# Patient Record
Sex: Female | Born: 1950
Health system: Southern US, Community
[De-identification: ages and names within clinical notes are randomized; demographics above are authoritative.]

## PROBLEM LIST (undated history)

## (undated) DIAGNOSIS — L309 Dermatitis, unspecified: Secondary | ICD-10-CM

## (undated) DIAGNOSIS — M1990 Unspecified osteoarthritis, unspecified site: Secondary | ICD-10-CM

## (undated) DIAGNOSIS — B019 Varicella without complication: Secondary | ICD-10-CM

## (undated) DIAGNOSIS — Z973 Presence of spectacles and contact lenses: Secondary | ICD-10-CM

## (undated) DIAGNOSIS — L509 Urticaria, unspecified: Secondary | ICD-10-CM

## (undated) DIAGNOSIS — J45909 Unspecified asthma, uncomplicated: Secondary | ICD-10-CM

## (undated) DIAGNOSIS — M479 Spondylosis, unspecified: Secondary | ICD-10-CM

## (undated) DIAGNOSIS — Z789 Other specified health status: Secondary | ICD-10-CM

## (undated) HISTORY — PX: KNEE SURGERY: SHX244

## (undated) HISTORY — DX: Urticaria, unspecified: L50.9

## (undated) HISTORY — DX: Varicella without complication: B01.9

## (undated) HISTORY — PX: COLONOSCOPY: SHX5424

## (undated) HISTORY — PX: TONSILLECTOMY: SUR1361

## (undated) HISTORY — DX: Spondylosis, unspecified: M47.9

---

## 1988-10-30 HISTORY — PX: OTHER SURGICAL HISTORY: SHX169

## 1993-03-01 HISTORY — PX: KNEE ARTHROSCOPY: SHX127

## 1998-01-28 ENCOUNTER — Other Ambulatory Visit: Admission: RE | Admit: 1998-01-28 | Discharge: 1998-01-28 | Payer: Self-pay | Admitting: Obstetrics and Gynecology

## 2000-03-01 HISTORY — PX: ABDOMINAL HYSTERECTOMY: SHX81

## 2000-04-25 ENCOUNTER — Other Ambulatory Visit: Admission: RE | Admit: 2000-04-25 | Discharge: 2000-04-25 | Payer: Self-pay | Admitting: Obstetrics and Gynecology

## 2001-02-27 ENCOUNTER — Encounter (INDEPENDENT_AMBULATORY_CARE_PROVIDER_SITE_OTHER): Payer: Self-pay | Admitting: Specialist

## 2001-02-28 ENCOUNTER — Inpatient Hospital Stay (HOSPITAL_COMMUNITY): Admission: RE | Admit: 2001-02-28 | Discharge: 2001-03-01 | Payer: Self-pay | Admitting: Obstetrics and Gynecology

## 2003-02-27 ENCOUNTER — Other Ambulatory Visit: Admission: RE | Admit: 2003-02-27 | Discharge: 2003-02-27 | Payer: Self-pay | Admitting: Obstetrics and Gynecology

## 2005-09-15 ENCOUNTER — Emergency Department (HOSPITAL_COMMUNITY): Admission: EM | Admit: 2005-09-15 | Discharge: 2005-09-15 | Payer: Self-pay | Admitting: Emergency Medicine

## 2008-06-17 ENCOUNTER — Emergency Department (HOSPITAL_COMMUNITY): Admission: EM | Admit: 2008-06-17 | Discharge: 2008-06-17 | Payer: Self-pay | Admitting: Emergency Medicine

## 2010-07-17 NOTE — H&P (Signed)
[facility]  Patient:    Cheryl Wang, Cheryl Wang Visit Number: [id number] MRN: [id number]          Service Type: Attending:  Juluis Mire, M.D. Dictated by:   Juluis Mire, M.D. Adm. Date:  02/27/01                           History and Physical  HISTORY:                      The patient is a 60 year old gravida 3, para 3 married white female who presents for laparoscopically assisted vaginal hysterectomy for management of symptoms uterine fibroids.  HISTORY OF PRESENT ILLNESS:   The patient has been followed with increasing menstrual flow and problems.  Her husband has had a previous vasectomy.  Her cycles are now approximately 2-1/2 weeks apart.  They are relatively heavy, lasting up to seven days.  She has increasing clotting and dysmenorrhea.  She is using tampons during the heavy days, and changes them every 2-3 hours. Ultrasounds have confirmed enlarging uterine fibroids.  The largest one measures up to approximately 4-5 cm.  Because of the abnormal bleeding, she was placed on birth control pills.  She did not really respond to these, and continued to have significant heavy bleeding.  We did do an endometrial sampling on her, which was unremarkable.  Because of continued bleeding unresponsive to conservative management thought to be secondary to uterine fibroids, she is scheduled to undergo laparoscopically assisted vaginal hysterectomy.  Other options have been discussed including other hormonal agents.  We have discussed radiologic embolization, endometrial ablation adequate myomectomy.  We have discussed the risks and benefits of ovarian removal versus leaving them in place.  She has decided to leave her ovaries in place.  She does understand the potential for malignant transformation.  ALLERGIES:                    No known drug allergies.  MEDICATIONS:                  Vioxx.  PAST MEDICAL HISTORY:         Usual childhood diseases.  No  significant sequelae.  PAST SURGICAL HISTORY:        Knee surgery.  PAST OBSTETRIC HISTORY:       Three vaginal deliveries.  FAMILY HISTORY:               Noncontributory.  SOCIAL HISTORY:               No tobacco or alcohol use.  REVIEW OF SYSTEMS:            Noncontributory.  PHYSICAL EXAMINATION:  VITAL SIGNS:                  The patient is afebrile with stable vital signs.  HEENT:                        Normocephalic.  Pupils equal, round and reactive to light and accommodation.  Extraocular movements intact.  Sclerae and conjunctivae clear.  Oropharynx clear.  NECK:                         Without thyromegaly.  BREASTS:  No discrete masses.  LUNGS:                        Clear.  CARDIOVASCULAR:               Regular rate and rhythm without murmurs or gallops.  ABDOMEN:                      Benign.  No masses, organomegaly or tenderness.  PELVIC:                       Normal external genitalia.  THe vaginal mucosa is clear.  The cervix is unremarkable.  The uterus is approximately ten weeks in size.  Adnexa unremarkable.  Rectovaginal exam clear.  EXTREMITIES:                  Trace edema.  NEUROLOGIC:                   Grossly within normal limits.  IMPRESSION:                   Uterine fibroids with associated menorrhagia unresponsive to conservative management.  PLAN:                         The patient will undergo laparoscopically assisted vaginal hysterectomy.  The risks of surgery have been discussed. This includes the risks of anesthesia; the risk of infection; the risk of hemorrhage that could necessitate transfusion; the risk of injury to adjacent organs including bladder, bowel or ureters that could require further exploratory surgery; the risk of deep venous thrombosis and pulmonary embolus. The patient expressed an understanding of the indications and risks and is accepting of them. Dictated by:   Juluis Mire,  M.D. Attending:  Juluis Mire, M.D. DD:  02/27/01 TD:  02/27/01 Job: [phone] [id number]

## 2010-07-17 NOTE — Op Note (Signed)
[facility]  Patient:    Cheryl Wang, Cheryl Wang Visit Number: [id number] MRN: [id number]          Service Type: DSU Location: 910A 9117 01 Attending Physician:  Frederich Balding Dictated by:   Juluis Mire, M.D. Proc. Date: 02/27/01 Admit Date:  02/27/2001                             Operative Report  PREOPERATIVE DIAGNOSES:       Uterine fibroids.  POSTOPERATIVE DIAGNOSES:      Uterine fibroids.  PROCEDURE:                    Laparoscopic assisted vaginal hysterectomy.  SURGEON:                      Juluis Mire, M.D.  ASSISTANT:                    Raynald Kemp, M.D.  ANESTHESIA:                   General endotracheal.  ESTIMATED BLOOD LOSS:         400 cc.  PACKS AND DRAINS:             None.  INTRAOPERATIVE BLOOD:         None.  COMPLICATIONS:                None.  INDICATIONS:                  As dictated in the history and physical.  PROCEDURE:                    Patient taken to the OR and placed in supine position.  After a satisfactory level of general endotracheal anesthesia was obtained the patient was placed in the dorsal lithotomy position using the Allen stirrups.  The abdomen, perineum, and vagina were prepped out with Betadine.  Examination under anesthesia revealed the uterus to be probably 9-10 weeks in size, irregular consistent with fibroids.  Adnexa: Unremarkable.  The bladder was emptied by in-and-out catheterization.  A Hulka tenaculum was put in place and secured.  The patient was then draped out for surgery.  A subumbilical incision was made with the knife.  The Veress needle was introduced into the abdominal cavity.  The abdomen was insufflated with approximately 4 L of carbon dioxide.  The operating laparoscope was introduced.  Visualization revealed no evidence of injury to adjacent organs. Two 5 mm trocars were put in place, one in the suprapubic area, the second one in the left lower quadrant.   Visualization revealed a normal appendix.  Upper abdomen including liver and tip of the gallbladder were clear.  Uterus was enlarged with multiple fibroids.  Tubes and ovaries were unremarkable.  There was no other evidence of pelvic pathology.  Both ureters were traced out along the pelvic side wall and noted to be well out of any of the operative field. Next, a 5 mm laparoscope was put in place in the left lower quadrant trocar. The LigaSure was then inserted through the subumbilical port.  We first went to the right side.  At this point in time the right mesosalpinx was cauterized and ligated.  The right tube was multiply cauterized and incised.  The right utero-ovarian pedicle was multiply cauterized and  incised.  With this we had good relief of the right adnexa.  We then went to the left side.  The left mesosalpinx was cauterized and incised.  The left tube was multiply cauterized and incised.  The left utero-ovarian pedicle was multiply cauterized and incised.  We had good hemostasis bilaterally and good freeing of the adnexa. At this point in time the LigaSure was removed from the subumbilical port. The laparoscope was then reinserted through the subumbilical port.  We visualized the pelvic cavity.  There was no active bleeding.  We had good release of the uterus.  At this point in time the laparoscope was removed and the abdomen was deinsufflated of its carbon dioxide.  The patients leg was now repositioned for vaginal surgery.  A weighted speculum was then placed in the vaginal vault.  The posterior lip of the cervix was grasped with a Hulka tenaculum.  The cul-de-sac was entered sharply.  Both uterosacral ligaments were clamped, cut, and suture ligated with 0 Vicryl.  These sutures were held.  The reflection of the vaginal mucosa anteriorly was then incised with the scissors.  The bladder was dissected cephalad.  Paracervical tissue was then clamped, cut, and suture ligated  with 0 Vicryl.  The bladder was further dissected superiorly.  At this point in time periuterine tissue was clamped, cut, and suture ligated with 0 Vicryl. The vesicouterine space was entered sharply and retractors put in place to retract the bladder superiorly out of the operative field.  Next, using the clamp, cut, and tie technique with suture ligatures of 0 Vicryl, the parametrium was serially separated from inside the uterus.  Uterus was then flipped.  The remaining pedicles were then clamped and cut and the uterus was passed off the operative field.  Held pedicles were then secured with free ties of 0 Vicryl.  There was no active bleeding.  A uterosacral plication stitch of 0 Vicryl was put in place and tied.  The vaginal mucosa was reapproximated in the midline with interrupted figure-of-eight of 0 Vicryl. This also brought together the uterosacral ligaments.  All held sutures were cut.  A Foley was placed to straight drain with adequate amount of clear urine being obtained.  A sponge on a sponge stick was then placed in the vaginal vault.  The patients legs were repositioned.  The abdomen was reinsufflated with carbon dioxide.  The laparoscope was reintroduced through the subumbilical port.  Visualization at this point revealed some minimal oozing from the vaginal cuff brought under control with the bipolar.  We thoroughly irrigated the pelvis.  We had good hemostasis at the cuff.  Both ovarian pedicles were also hemostatically intact.  There was no injury to adjacent organs.  The abdomen was deinsufflated of its carbon dioxide and all trocars were then removed.  Subumbilical incision was closed with interrupted subcuticulars of 4-0 Vicryl.  The suprapubic incisions were closed with Steri-Strips.  Sponge on sponge stick was then removed from the vaginal vault.  Sponge, instrument, needle counts reported as correct by circulated nurse x 2.  Foley catheter remained clear at the time  of closure.  The patient, once extubated, was transferred to the recovery room in good condition. Dictated by:   Juluis Mire, M.D.  Attending Physician:  Frederich Balding DD:  02/27/01 TD:  02/27/01 Job: [id number]

## 2010-07-17 NOTE — Discharge Summary (Signed)
[facility]  Patient:    Cheryl Wang, STREET Visit Number: [id number] MRN: [id number]          Service Type: DSU Location: 910A 9117 01 Attending Physician:  Frederich Balding Dictated by:   Juluis Mire, M.D. Admit Date:  02/27/2001 Discharge Date: 03/01/2001                             Discharge Summary  ADMISSION DIAGNOSIS:  Uterine fibroids.  DISCHARGE DIAGNOSIS:  Uterine fibroids.  PROCEDURE:  Laparoscopic assisted vaginal hysterectomy.  HISTORY OF PRESENT ILLNESS:  For complete history and physical, please see dictated note.  HOSPITAL COURSE:  The patient underwent the above noted surgery.  Pathology is still pending.  Postoperatively, did well.  Postoperative hemoglobin 11.7. Stayed in the hospital for the first postoperative day.  Just some urinary retention.  Subsequent discharge home on her second postoperative day.  At that time, she was afebrile with stable vital signs.  She was tolerating a regular diet.  She was ambulating without difficulty.  She had normal bowel and bladder function at that point in time.  COMPLICATIONS:  None.  CONDITION ON DISCHARGE:  Discharged home in stable condition.  DISPOSITION:  Routine postoperative instructions were already given.  ACTIVITY:  She is to avoid heavy lifting, vaginal entrance, or driving a car.  DISCHARGE MEDICATIONS:  Tylox p.r.n. pain.  FOLLOWUP:  In the office in one week.  She will call with fever, nausea, vomiting, increasing abdominal pain, or active vaginal bleeding. Dictated by:   Juluis Mire, M.D. Attending Physician:  Frederich Balding DD:  03/01/01 TD:  03/01/01 Job: [id number]

## 2011-09-07 ENCOUNTER — Ambulatory Visit (INDEPENDENT_AMBULATORY_CARE_PROVIDER_SITE_OTHER): Admitting: Internal Medicine

## 2011-09-07 VITALS — BP 138/80 | HR 73 | Temp 98.2°F | Resp 18 | Ht 63.5 in | Wt 180.6 lb

## 2011-09-07 DIAGNOSIS — L255 Unspecified contact dermatitis due to plants, except food: Secondary | ICD-10-CM

## 2011-09-07 DIAGNOSIS — L237 Allergic contact dermatitis due to plants, except food: Secondary | ICD-10-CM

## 2011-09-07 DIAGNOSIS — R451 Restlessness and agitation: Secondary | ICD-10-CM

## 2011-09-07 MED ORDER — ALPRAZOLAM 1 MG PO TABS: 1.0000 mg | ORAL_TABLET | Freq: Every evening | ORAL | Status: AC | PRN

## 2011-09-07 MED ORDER — METHYLPREDNISOLONE ACETATE 80 MG/ML IJ SUSP
80.0000 mg | Freq: Once | INTRAMUSCULAR | Status: AC
  Administered 2011-09-07: 80 mg via INTRAMUSCULAR

## 2011-09-07 MED ORDER — PREDNISONE 20 MG PO TABS: ORAL_TABLET | ORAL | Status: DC

## 2011-09-07 NOTE — Patient Instructions (Addendum)
Poison Ivy Poison ivy is a inflammation of the skin (contact dermatitis) caused by touching the allergens on the leaves of the ivy plant following previous exposure to the plant. The rash usually appears 48 hours after exposure. The rash is usually bumps (papules) or blisters (vesicles) in a linear pattern. Depending on your own sensitivity, the rash may simply cause redness and itching, or it may also progress to blisters which may break open. These must be well cared for to prevent secondary bacterial (germ) infection, followed by scarring. Keep any open areas dry, clean, dressed, and covered with an antibacterial ointment if needed. The eyes may also get puffy. The puffiness is worst in the morning and gets better as the day progresses. This dermatitis usually heals without scarring, within 2 to 3 weeks without treatment. HOME CARE INSTRUCTIONS  Thoroughly wash with soap and water as soon as you have been exposed to poison ivy. You have about one half hour to remove the plant resin before it will cause the rash. This washing will destroy the oil or antigen on the skin that is causing, or will cause, the rash. Be sure to wash under your fingernails as any plant resin there will continue to spread the rash. Do not rub skin vigorously when washing affected area. Poison ivy cannot spread if no oil from the plant remains on your body. A rash that has progressed to weeping sores will not spread the rash unless you have not washed thoroughly. It is also important to wash any clothes you have been wearing as these may carry active allergens. The rash will return if you wear the unwashed clothing, even several days later. Avoidance of the plant in the future is the best measure. Poison ivy plant can be recognized by the number of leaves. Generally, poison ivy has three leaves with flowering branches on a single stem. Diphenhydramine may be purchased over the counter and used as needed for itching. Do not drive with  this medication if it makes you drowsy.Ask your caregiver about medication for children. SEEK MEDICAL CARE IF:  Open sores develop.   Redness spreads beyond area of rash.   You notice purulent (pus-like) discharge.   You have increased pain.   Other signs of infection develop (such as fever).  Document Released: 02/13/2000 Document Revised: 02/04/2011 Document Reviewed: 01/01/2009 Exit[employer] Patient Information 2012 [employer]. 

## 2011-09-07 NOTE — Progress Notes (Signed)
  Subjective:    Patient ID: Cheryl Wang, female    DOB: 01/02/1951, 61 y.o.   MRN: [id number]  HPI Has poison ivy face, arms, legs, trunk all stages. No fever, no pain only itching. No previous attacks   Review of Systems    neg Objective:   Physical Exam Classic PI       Assessment & Plan:  Depomedrol 80mg  im Pred 60-40-20-12d taper Alprazolam 1mg  prn agitation

## 2012-06-20 ENCOUNTER — Other Ambulatory Visit: Payer: Self-pay | Admitting: Orthopedic Surgery

## 2012-08-25 ENCOUNTER — Encounter (HOSPITAL_BASED_OUTPATIENT_CLINIC_OR_DEPARTMENT_OTHER): Payer: Self-pay | Admitting: *Deleted

## 2012-08-25 NOTE — Progress Notes (Signed)
PT ON VACATION DENIES ANY HEALTH PROBLEMS-NO LABS NEEDED

## 2012-08-30 ENCOUNTER — Ambulatory Visit (HOSPITAL_BASED_OUTPATIENT_CLINIC_OR_DEPARTMENT_OTHER)
Admission: RE | Admit: 2012-08-30 | Discharge: 2012-08-30 | Disposition: A | Discharge status: Home / Self Care | Payer: Worker's Compensation | Source: Ambulatory Visit | Attending: Orthopedic Surgery | Admitting: Orthopedic Surgery

## 2012-08-30 ENCOUNTER — Encounter (HOSPITAL_BASED_OUTPATIENT_CLINIC_OR_DEPARTMENT_OTHER): Payer: Self-pay | Admitting: Orthopedic Surgery

## 2012-08-30 ENCOUNTER — Encounter (HOSPITAL_BASED_OUTPATIENT_CLINIC_OR_DEPARTMENT_OTHER): Payer: Self-pay | Admitting: Anesthesiology

## 2012-08-30 ENCOUNTER — Ambulatory Visit (HOSPITAL_BASED_OUTPATIENT_CLINIC_OR_DEPARTMENT_OTHER): Payer: Worker's Compensation | Admitting: Anesthesiology

## 2012-08-30 ENCOUNTER — Encounter (HOSPITAL_BASED_OUTPATIENT_CLINIC_OR_DEPARTMENT_OTHER)
Admission: RE | Disposition: A | Discharge status: Home / Self Care | Payer: Self-pay | Source: Ambulatory Visit | Attending: Orthopedic Surgery

## 2012-08-30 DIAGNOSIS — M6530 Trigger finger, unspecified finger: Secondary | ICD-10-CM | POA: Insufficient documentation

## 2012-08-30 DIAGNOSIS — Z87891 Personal history of nicotine dependence: Secondary | ICD-10-CM | POA: Insufficient documentation

## 2012-08-30 DIAGNOSIS — G5600 Carpal tunnel syndrome, unspecified upper limb: Secondary | ICD-10-CM | POA: Insufficient documentation

## 2012-08-30 HISTORY — DX: Other specified health status: Z78.9

## 2012-08-30 HISTORY — DX: Unspecified osteoarthritis, unspecified site: M19.90

## 2012-08-30 HISTORY — DX: Presence of spectacles and contact lenses: Z97.3

## 2012-08-30 HISTORY — PX: CARPAL TUNNEL RELEASE: SHX101

## 2012-08-30 HISTORY — PX: TRIGGER FINGER RELEASE: SHX641

## 2012-08-30 SURGERY — CARPAL TUNNEL RELEASE
Anesthesia: Monitor Anesthesia Care | Site: Wrist | Laterality: Left | Wound class: Clean

## 2012-08-30 MED ORDER — HYDROCODONE-ACETAMINOPHEN 5-325 MG PO TABS: 1.0000 | ORAL_TABLET | Freq: Four times a day (QID) | ORAL | Status: DC | PRN

## 2012-08-30 MED ORDER — MIDAZOLAM HCL 5 MG/5ML IJ SOLN
INTRAMUSCULAR | Status: DC | PRN
  Administered 2012-08-30: 2 mg via INTRAVENOUS

## 2012-08-30 MED ORDER — VANCOMYCIN HCL IN DEXTROSE 1-5 GM/200ML-% IV SOLN
1000.0000 mg | INTRAVENOUS | Status: AC
Start: 2012-08-30 — End: 2012-08-30
  Administered 2012-08-30: 1000 mg via INTRAVENOUS

## 2012-08-30 MED ORDER — OXYCODONE HCL 5 MG PO TABS: 5.0000 mg | ORAL_TABLET | Freq: Once | ORAL | Status: DC | PRN

## 2012-08-30 MED ORDER — CHLORHEXIDINE GLUCONATE 4 % EX LIQD: 60.0000 mL | Freq: Once | CUTANEOUS | Status: DC

## 2012-08-30 MED ORDER — MIDAZOLAM HCL 2 MG/2ML IJ SOLN: 1.0000 mg | INTRAMUSCULAR | Status: DC | PRN

## 2012-08-30 MED ORDER — BUPIVACAINE HCL (PF) 0.25 % IJ SOLN
INTRAMUSCULAR | Status: DC | PRN
  Administered 2012-08-30: 10 mL

## 2012-08-30 MED ORDER — ONDANSETRON HCL 4 MG/2ML IJ SOLN: 4.0000 mg | Freq: Once | INTRAMUSCULAR | Status: DC | PRN

## 2012-08-30 MED ORDER — LACTATED RINGERS IV SOLN
INTRAVENOUS | Status: DC
  Administered 2012-08-30: 09:00:00 via INTRAVENOUS

## 2012-08-30 MED ORDER — PROPOFOL INFUSION 10 MG/ML OPTIME
INTRAVENOUS | Status: DC | PRN
  Administered 2012-08-30: 100 ug/kg/min via INTRAVENOUS

## 2012-08-30 MED ORDER — FENTANYL CITRATE 0.05 MG/ML IJ SOLN
INTRAMUSCULAR | Status: DC | PRN
  Administered 2012-08-30 (×2): 50 ug via INTRAVENOUS

## 2012-08-30 MED ORDER — OXYCODONE HCL 5 MG/5ML PO SOLN: 5.0000 mg | Freq: Once | ORAL | Status: DC | PRN

## 2012-08-30 MED ORDER — HYDROMORPHONE HCL PF 1 MG/ML IJ SOLN: 0.2500 mg | INTRAMUSCULAR | Status: DC | PRN

## 2012-08-30 MED ORDER — LIDOCAINE HCL (PF) 0.5 % IJ SOLN
INTRAMUSCULAR | Status: DC | PRN
  Administered 2012-08-30: 30 mL via INTRAVENOUS

## 2012-08-30 MED ORDER — FENTANYL CITRATE 0.05 MG/ML IJ SOLN: 50.0000 ug | INTRAMUSCULAR | Status: DC | PRN

## 2012-08-30 SURGICAL SUPPLY — 41 items
BANDAGE COBAN STERILE 2 (GAUZE/BANDAGES/DRESSINGS) ×3 IMPLANT
BANDAGE GAUZE ELAST BULKY 4 IN (GAUZE/BANDAGES/DRESSINGS) ×3 IMPLANT
BLADE SURG 15 STRL LF DISP TIS (BLADE) ×2 IMPLANT
BLADE SURG 15 STRL SS (BLADE) ×3
BNDG CMPR 9X4 STRL LF SNTH (GAUZE/BANDAGES/DRESSINGS) ×2
BNDG COHESIVE 3X5 TAN STRL LF (GAUZE/BANDAGES/DRESSINGS) ×3 IMPLANT
BNDG ESMARK 4X9 LF (GAUZE/BANDAGES/DRESSINGS) ×1 IMPLANT
CHLORAPREP W/TINT 26ML (MISCELLANEOUS) ×3 IMPLANT
CLOTH BEACON ORANGE TIMEOUT ST (SAFETY) ×3 IMPLANT
CORDS BIPOLAR (ELECTRODE) ×3 IMPLANT
COVER MAYO STAND STRL (DRAPES) ×3 IMPLANT
COVER TABLE BACK 60X90 (DRAPES) ×3 IMPLANT
CUFF TOURNIQUET SINGLE 18IN (TOURNIQUET CUFF) ×3 IMPLANT
DECANTER SPIKE VIAL GLASS SM (MISCELLANEOUS) IMPLANT
DRAPE EXTREMITY T 121X128X90 (DRAPE) ×3 IMPLANT
DRAPE SURG 17X23 STRL (DRAPES) ×3 IMPLANT
DRSG KUZMA FLUFF (GAUZE/BANDAGES/DRESSINGS) ×3 IMPLANT
GAUZE XEROFORM 1X8 LF (GAUZE/BANDAGES/DRESSINGS) ×3 IMPLANT
GLOVE BIO SURGEON STRL SZ 6.5 (GLOVE) ×4 IMPLANT
GLOVE BIOGEL PI IND STRL 7.0 (GLOVE) IMPLANT
GLOVE BIOGEL PI IND STRL 8.5 (GLOVE) ×2 IMPLANT
GLOVE BIOGEL PI INDICATOR 7.0 (GLOVE) ×2
GLOVE BIOGEL PI INDICATOR 8.5 (GLOVE) ×1
GLOVE SURG ORTHO 8.0 STRL STRW (GLOVE) ×3 IMPLANT
GOWN BRE IMP PREV XXLGXLNG (GOWN DISPOSABLE) ×3 IMPLANT
GOWN PREVENTION PLUS XLARGE (GOWN DISPOSABLE) ×4 IMPLANT
NEEDLE 27GAX1X1/2 (NEEDLE) ×3 IMPLANT
NS IRRIG 1000ML POUR BTL (IV SOLUTION) ×3 IMPLANT
PACK BASIN DAY SURGERY FS (CUSTOM PROCEDURE TRAY) ×3 IMPLANT
PAD CAST 3X4 CTTN HI CHSV (CAST SUPPLIES) ×2 IMPLANT
PADDING CAST ABS 4INX4YD NS (CAST SUPPLIES)
PADDING CAST ABS COTTON 4X4 ST (CAST SUPPLIES) ×2 IMPLANT
PADDING CAST COTTON 3X4 STRL (CAST SUPPLIES) ×3
SPONGE GAUZE 4X4 12PLY (GAUZE/BANDAGES/DRESSINGS) ×3 IMPLANT
STOCKINETTE 4X48 STRL (DRAPES) ×3 IMPLANT
SUT VICRYL 4-0 PS2 18IN ABS (SUTURE) IMPLANT
SUT VICRYL RAPIDE 4/0 PS 2 (SUTURE) ×3 IMPLANT
SYR BULB 3OZ (MISCELLANEOUS) ×3 IMPLANT
SYR CONTROL 10ML LL (SYRINGE) ×3 IMPLANT
TOWEL OR 17X24 6PK STRL BLUE (TOWEL DISPOSABLE) ×6 IMPLANT
UNDERPAD 30X30 INCONTINENT (UNDERPADS AND DIAPERS) ×3 IMPLANT

## 2012-08-30 NOTE — Op Note (Signed)
Dictation Number [id number]

## 2012-08-30 NOTE — Brief Op Note (Signed)
08/30/2012  9:52 AM  PATIENT:  Cheryl Wang  62 y.o. female  PRE-OPERATIVE DIAGNOSIS:  LEFFT CARPAL TUNNEL SYNDROME, STS LEFT THUMB  POST-OPERATIVE DIAGNOSIS:  LEFFT CARPAL TUNNEL SYNDROME, STS LEFT THU  PROCEDURE:  Procedure(s): CARPAL TUNNEL RELEASE (Left) STS RELEASE LEFT THUMB (Left)  SURGEON:  Surgeon(s) and Role:    * Nicki Reaper, MD - Primary  PHYSICIAN ASSISTANT:   ASSISTANTS: none   ANESTHESIA:   local and regional  EBL:  Total I/O In: 400 [I.V.:400] Out: -   BLOOD ADMINISTERED:none  DRAINS: none   LOCAL MEDICATIONS USED:  MARCAINE     SPECIMEN:  No Specimen  DISPOSITION OF SPECIMEN:  N/A  COUNTS:  YES  TOURNIQUET:   Total Tourniquet Time Documented: Forearm (Left) - 21 minutes Total: Forearm (Left) - 21 minutes   DICTATION: .Other Dictation: Dictation Number [id number]  PLAN OF CARE: Discharge to home after PACU  PATIENT DISPOSITION:  PACU - hemodynamically stable.

## 2012-08-30 NOTE — Anesthesia Postprocedure Evaluation (Signed)
  Anesthesia Post-op Note  Patient: Cheryl Wang  Procedure(s) Performed: Procedure(s): CARPAL TUNNEL RELEASE (Left) STS RELEASE LEFT THUMB (Left)  Patient Location: PACU  Anesthesia Type:MAC and Bier block  Level of Consciousness: awake, alert  and oriented  Airway and Oxygen Therapy: Patient Spontanous Breathing and Patient connected to face mask oxygen  Post-op Pain: none  Post-op Assessment: Post-op Vital signs reviewed  Post-op Vital Signs: Reviewed  Complications: No apparent anesthesia complications

## 2012-08-30 NOTE — Transfer of Care (Signed)
Immediate Anesthesia Transfer of Care Note  Patient: Cheryl Wang  Procedure(s) Performed: Procedure(s): CARPAL TUNNEL RELEASE (Left) STS RELEASE LEFT THUMB (Left)  Patient Location: PACU  Anesthesia Type:MAC and Bier block  Level of Consciousness: awake, alert  and oriented  Airway & Oxygen Therapy: Patient Spontanous Breathing and Patient connected to face mask oxygen  Post-op Assessment: Report given to PACU RN and Post -op Vital signs reviewed and stable  Post vital signs: Reviewed and stable  Complications: No apparent anesthesia complications

## 2012-08-30 NOTE — H&P (Signed)
Cheryl Wang is a 62 year-old right-hand dominant female complaining of numbness and tingling in her hands bilaterally, left greater than right, catching of her left thumb, pain in the radial aspect of her hand.  She is awakened 7 out of 7 nights.  This has been going on for approximately one year.  The thumb has been catching for six months.  She has no history of injury to the hand or to the neck.  She states that writing frequently causes numbness and tingling.  She states that the numbness and tingling has been going on for the past two to three years. She feels this is secondary to her work at [employer] for 20 years. She complains of constant severe sharp aching type pain with a feeling of numbness and weakness.  Activity makes it worse, rest makes it better. She has not tried taking anything for this nor has she had any treatment.  There is a family history of gout.    She has  no history of diabetes, thyroid problems, arthritis or gout. She has had her nerve conductions done and this reveals bilateral carpal tunnel syndrome with a motor delay of 4.7 on the left and 4.3 on the right, sensory delay of 2.8 on the left and 2.7 on the right with an amplitude diminution to 17 on the left and 36 on the right.   MEDICATIONS:     None.  SURGICAL HISTORY:     Hysterectomy and knee surgery.   FAMILY MEDICAL HISTORY:    Negative.  SOCIAL HISTORY:     She does not smoke or drink.  She is a Nutritional therapist.  REVIEW OF SYSTEMS:    Positive for glasses, otherwise negative 14 points. Cheryl Wang is an 62 y.o. female.   Chief Complaint: CTS left STS left thumb HPI: see above  Past Medical History  Diagnosis Date  . Chicken pox   . Hives   . Medical history non-contributory   . Arthritis   . Wears glasses     Past Surgical History  Procedure Laterality Date  . Knee surgery    . Cyst removal  10/1988    on chest   . Abdominal hysterectomy  2002    VAG HYST-  . Knee arthroscopy  1995    RIGHT  AND LEFT  . Colonoscopy    . Tonsillectomy      Family History  Problem Relation Age of Onset  . Anorectal malformation Son    Social History:  reports that she quit smoking about 34 years ago. She does not have any smokeless tobacco history on file. She reports that  drinks alcohol. She reports that she does not use illicit drugs.  Allergies:  Allergies  Allergen Reactions  . Penicillins     No prescriptions prior to admission    No results found for this or any previous visit (from the past 48 hour(s)).  No results found.   Pertinent items are noted in HPI.  Height 5' 3.5" (1.613 m), weight 86.183 kg (190 lb).  General appearance: alert, cooperative and appears stated age Head: Normocephalic, without obvious abnormality Neck: no JVD Resp: clear to auscultation bilaterally Cardio: regular rate and rhythm, S1, S2 normal, no murmur, click, rub or gallop GI: soft, non-tender; bowel sounds normal; no masses,  no organomegaly Extremities: extremities normal, atraumatic, no cyanosis or edema Pulses: 2+ and symmetric Skin: Skin color, texture, turgor normal. No rashes or lesions Neurologic: Grossly normal Incision/Wound: na  Assessment/Plan We discussed the implications of this with her. She continues to have triggering of her thumb without pain but the numbness and tingling continues along with triggering. We have discussed the possibility of further injections of the carpal tunnel and injection of the STS. She is aware that 2 injections without resolution will require surgical intervention. She would like to proceed to have this surgically released on her left side but would like to wait until June. The pre, peri and post op course are discussed along with risks and complications.  We have discussed there is no guarantee with surgery, possibility of infection, recurrence, injury to arteries, nerves and tendons, incomplete relief of symptoms and dystrophy.  She would like to  proceed. She is scheduled for left carpal tunnel release with release A-1 pulley left thumb.  Cheryl Wang 08/30/2012, 7:02 AM

## 2012-08-30 NOTE — Anesthesia Procedure Notes (Signed)
Anesthesia Regional Block:  Bier block (IV Regional)  Pre-Anesthetic Checklist: ,, timeout performed, Correct Patient, Correct Site, Correct Laterality, Correct Procedure,, site marked, surgical consent,, at surgeon's request Needles:  Injection technique: Single-shot  Needle Type: Other      Needle Gauge: 22 and 22 G    Additional Needles: Bier block (IV Regional) Narrative:   Performed by: Personally   Bier block (IV Regional)   

## 2012-08-30 NOTE — Anesthesia Preprocedure Evaluation (Signed)
Anesthesia Evaluation  Patient identified by MRN, date of birth, ID band Patient awake    Reviewed: Allergy & Precautions, H&P , NPO status , Patient's Chart, lab work & pertinent test results  Airway Mallampati: I TM Distance: >3 FB Neck ROM: Full    Dental  (+) Teeth Intact and Dental Advisory Given   Pulmonary  breath sounds clear to auscultation        Cardiovascular Rhythm:Regular Rate:Normal     Neuro/Psych    GI/Hepatic   Endo/Other    Renal/GU      Musculoskeletal   Abdominal   Peds  Hematology   Anesthesia Other Findings   Reproductive/Obstetrics                           Anesthesia Physical Anesthesia Plan  ASA: I  Anesthesia Plan: Bier Block   Post-op Pain Management:    Induction: Intravenous  Airway Management Planned: Simple Face Mask  Additional Equipment:   Intra-op Plan:   Post-operative Plan:   Informed Consent: I have reviewed the patients History and Physical, chart, labs and discussed the procedure including the risks, benefits and alternatives for the proposed anesthesia with the patient or authorized representative who has indicated his/her understanding and acceptance.   Dental advisory given  Plan Discussed with: CRNA, Anesthesiologist and Surgeon  Anesthesia Plan Comments:         Anesthesia Quick Evaluation

## 2012-08-31 NOTE — Op Note (Signed)
NAMENeil Wang.:  [id number]  MEDICAL RECORD NO.:  [id number]  LOCATION:                                 FACILITY:  PHYSICIAN:  Cindee Salt, M.D.            DATE OF BIRTH:  DATE OF PROCEDURE:  08/30/2012 DATE OF DISCHARGE:                              OPERATIVE REPORT   PREOPERATIVE DIAGNOSIS:  Carpal tunnel syndrome, left hand with stenosing tenosynovitis, left thumb.  POSTOPERATIVE DIAGNOSIS:  Carpal tunnel syndrome, left hand with stenosing tenosynovitis, left thumb.  OPERATION:  Release A1 pulley, left thumb with carpal tunnel release, left hand.  SURGEON:  Cindee Salt, MD  ANESTHESIA:  Forearm-based IV regional with sedation local infiltration.  ANESTHESIOLOGIST:  Crews.  HISTORY:  The patient is a 62 year old female with a history of carpal tunnel syndrome, and triggering of her left thumb.  This has not responded to conservative treatment.  She has elected to undergo surgical decompression of each.  She is aware of risks and complications including infection; recurrence of injury to arteries, nerves, tendons, incomplete relief of symptoms, dystrophy.  In the preoperative area, the patient is seen, the extremity marked by both the patient and surgeon. Antibiotic given.  PROCEDURE DESCRIPTION:  The patient was brought to the operating room where a forearm IV regional anesthetic was carried out without difficulty.  She was prepped using ChloraPrep, supine position, left arm free.  A 3-minute dry time was allowed.  Time-out taken, confirming the patient and procedure.  The thumb was attended to 1st.  Transverse incision was made over the A1 pulley left thumb carried down through subcutaneous tissue.  Neurovascular bundles were identified, protected with retractors.  A release of the A1 pulley was performed on its radial aspect taking care to protect the oblique pulley.  The thumb placed through a full range motion.  No further triggering  was noted.  The wound was irrigated and closed with 4-0 Vicryl Rapide sutures.  Local infiltration with 0.25% Marcaine without epinephrine was given to each of the incision areas during the case to assist in anesthesia. Following release of the A1 pulley of the thumb, a longitudinal incision was made in the left palm carried down through subcutaneous tissue. Bleeders again electrocauterized with bipolar.  The palmar fascia was split, superficial palmar arch identified.  The flexor tendon to the ring and little finger identified to the ulnar side of the median nerve. The carpal retinaculum was incised with sharp dissection.  A right-angle and Sewall retractor were placed between skin and forearm fascia.  The fascia released for approximately a centimeter and half proximal to the wrist crease under direct vision.  The motor branch was noted to enter into muscle.  No further lesions were identified.  Area of compression to the nerve was apparent.  The wound was irrigated and the skin closed with interrupted 4-0 Vicryl Rapide sutures.  Sterile compressive dressing with fingers free was applied.  On deflation of the tourniquet, all fingers immediately pinked.  She was taken to the recovery room for observation in satisfactory condition.  She will be discharged home  to return to the [facility] [place] in 1 week on Vicodin.          ______________________________ Cindee Salt, M.D.     GK/MEDQ  D:  08/30/2012  T:  08/31/2012  Job:  [id number]

## 2012-09-04 ENCOUNTER — Encounter (HOSPITAL_BASED_OUTPATIENT_CLINIC_OR_DEPARTMENT_OTHER): Payer: Self-pay | Admitting: Orthopedic Surgery

## 2012-11-24 ENCOUNTER — Encounter: Payer: Self-pay | Admitting: Family Medicine

## 2012-11-24 ENCOUNTER — Ambulatory Visit (INDEPENDENT_AMBULATORY_CARE_PROVIDER_SITE_OTHER): Admitting: Family Medicine

## 2012-11-24 ENCOUNTER — Ambulatory Visit

## 2012-11-24 VITALS — BP 144/82 | HR 71 | Temp 98.1°F | Resp 16 | Ht 63.0 in | Wt 201.0 lb

## 2012-11-24 DIAGNOSIS — M47812 Spondylosis without myelopathy or radiculopathy, cervical region: Secondary | ICD-10-CM | POA: Insufficient documentation

## 2012-11-24 DIAGNOSIS — Z1211 Encounter for screening for malignant neoplasm of colon: Secondary | ICD-10-CM

## 2012-11-24 DIAGNOSIS — E669 Obesity, unspecified: Secondary | ICD-10-CM

## 2012-11-24 DIAGNOSIS — M479 Spondylosis, unspecified: Secondary | ICD-10-CM

## 2012-11-24 DIAGNOSIS — M1711 Unilateral primary osteoarthritis, right knee: Secondary | ICD-10-CM | POA: Insufficient documentation

## 2012-11-24 DIAGNOSIS — M542 Cervicalgia: Secondary | ICD-10-CM

## 2012-11-24 DIAGNOSIS — Z8669 Personal history of other diseases of the nervous system and sense organs: Secondary | ICD-10-CM

## 2012-11-24 DIAGNOSIS — M1712 Unilateral primary osteoarthritis, left knee: Secondary | ICD-10-CM | POA: Insufficient documentation

## 2012-11-24 DIAGNOSIS — R011 Cardiac murmur, unspecified: Secondary | ICD-10-CM

## 2012-11-24 DIAGNOSIS — Z0000 Encounter for general adult medical examination without abnormal findings: Secondary | ICD-10-CM

## 2012-11-24 HISTORY — DX: Spondylosis, unspecified: M47.9

## 2012-11-24 LAB — CBC WITH DIFFERENTIAL/PLATELET
Basophils Absolute: 0 10*3/uL (ref 0.0–0.1)
Basophils Relative: 0 % (ref 0–1)
Eosinophils Absolute: 0.2 10*3/uL (ref 0.0–0.7)
Eosinophils Relative: 2 % (ref 0–5)
Lymphs Abs: 1.4 10*3/uL (ref 0.7–4.0)
MCH: 29 pg (ref 26.0–34.0)
MCV: 85.3 fL (ref 78.0–100.0)
Monocytes Relative: 9 % (ref 3–12)
Neutro Abs: 5.1 10*3/uL (ref 1.7–7.7)
Neutrophils Relative %: 70 % (ref 43–77)
Platelets: 294 10*3/uL (ref 150–400)
RBC: 4.76 MIL/uL (ref 3.87–5.11)
RDW: 13.4 % (ref 11.5–15.5)
WBC: 7.4 10*3/uL (ref 4.0–10.5)

## 2012-11-24 LAB — LIPID PANEL
HDL: 60 mg/dL (ref >39)
LDL Cholesterol: 149 mg/dL — ABNORMAL HIGH (ref 0–99)
Total CHOL/HDL Ratio: 3.8 Ratio
Triglycerides: 79 mg/dL (ref <150)
VLDL: 16 mg/dL (ref 0–40)

## 2012-11-24 LAB — C-REACTIVE PROTEIN: CRP: <0.5 mg/dL (ref <0.60)

## 2012-11-24 LAB — COMPREHENSIVE METABOLIC PANEL
ALT: 14 U/L (ref 0–35)
AST: 16 U/L (ref 0–37)
BUN: 15 mg/dL (ref 6–23)
Chloride: 104 mEq/L (ref 96–112)
Creat: 0.64 mg/dL (ref 0.50–1.10)
Total Bilirubin: 0.5 mg/dL (ref 0.3–1.2)

## 2012-11-24 LAB — POCT URINALYSIS DIPSTICK
Glucose, UA: NEGATIVE
Ketones, UA: NEGATIVE
Spec Grav, UA: 1.015
Urobilinogen, UA: 0.2

## 2012-11-24 LAB — POCT GLYCOSYLATED HEMOGLOBIN (HGB A1C): Hemoglobin A1C: 5.4

## 2012-11-24 NOTE — Patient Instructions (Signed)
I have ordered a cardiac ECHO to look at the heart valves and heart chambers.   Your neck xray shows some early degenerative changes (arthritis) in the bone/disc portions of your neck.  Degenerative Disk Disease Degenerative disk disease is a condition caused by the changes that occur in the cushions of the backbone (spinal disks) as you grow older. Spinal disks are soft and compressible disks located between the bones of the spine (vertebrae). They act like shock absorbers. Degenerative disk disease can affect the whole spine. However, the neck and lower back are most commonly affected. Many changes can occur in the spinal disks with aging, such as:  The spinal disks may dry and shrink.  Small tears may occur in the tough, outer covering of the disk (annulus).  The disk space may become smaller due to loss of water.  Abnormal growths in the bone (spurs) may occur. This can put pressure on the nerve roots exiting the spinal canal, causing pain.  The spinal canal may become narrowed. CAUSES  Degenerative disk disease is a condition caused by the changes that occur in the spinal disks with aging. The exact cause is not known, but there is a genetic basis for many patients. Degenerative changes can occur due to loss of fluid in the disk. This makes the disk thinner and reduces the space between the backbones. Small cracks can develop in the outer layer of the disk. This can lead to the breakdown of the disk. You are more likely to get degenerative disk disease if you are overweight. Smoking cigarettes and doing heavy work such as weightlifting can also increase your risk of this condition. Degenerative changes can start after a sudden injury. Growth of bone spurs can compress the nerve roots and cause pain.  SYMPTOMS  The symptoms vary from person to person. Some people may have no pain, while others have severe pain. The pain may be so severe that it can limit your activities. The location of the  pain depends on the part of your backbone that is affected. You will have neck or arm pain if a disk in the neck area is affected. You will have pain in your back, buttocks, or legs if a disk in the lower back is affected. The pain becomes worse while bending, reaching up, or with twisting movements. The pain may start gradually and then get worse as time passes. It may also start after a major or minor injury. You may feel numbness or tingling in the arms or legs.  DIAGNOSIS  Your caregiver will ask you about your symptoms and about activities or habits that may cause the pain. He or she may also ask about any injuries, diseases, or treatments you have had earlier. Your caregiver will examine you to check for the range of movement that is possible in the affected area, to check for strength in your extremities, and to check for sensation in the areas of the arms and legs supplied by different nerve roots. An X-ray of the spine may be taken. Your caregiver may suggest other imaging tests, such as magnetic resonance imaging (MRI), if needed.  TREATMENT  Treatment includes rest, modifying your activities, and applying ice and heat. Your caregiver may prescribe medicines to reduce your pain and may ask you to do some exercises to strengthen your back. In some cases, you may need surgery. You and your caregiver will decide on the treatment that is best for you. HOME CARE INSTRUCTIONS   Follow proper  lifting and walking techniques as advised by your caregiver.  Maintain good posture.  Exercise regularly as advised.  Perform relaxation exercises.  Change your sitting, standing, and sleeping habits as advised. Change positions frequently.  Lose weight as advised.  Stop smoking if you smoke.  Wear supportive footwear. SEEK MEDICAL CARE IF:  Your pain does not go away within 1 to 4 weeks. SEEK IMMEDIATE MEDICAL CARE IF:   Your pain is severe.  You notice weakness in your arms, hands, or  legs.  You begin to lose control of your bladder or bowel movements. MAKE SURE YOU:   Understand these instructions.  Will watch your condition.  Will get help right away if you are not doing well or get worse. Document Released: 12/13/2006 Document Revised: 05/10/2011 Document Reviewed: 12/13/2006 [facility] Patient Information 2014 [place], [place].

## 2012-11-24 NOTE — Progress Notes (Signed)
  Subjective:    Patient ID: Cheryl Wang, female    DOB: [birth year], 62 y.o.   MRN: [id number]  HPI    Review of Systems  Constitutional: Negative.   HENT: Negative.   Eyes: Negative.   Respiratory: Negative.   Cardiovascular: Negative.   Gastrointestinal: Positive for diarrhea and constipation.  Endocrine: Negative.   Genitourinary: Negative.   Musculoskeletal: Positive for myalgias and joint swelling.  Skin: Negative.   Allergic/Immunologic: Negative.   Neurological: Negative.   Hematological: Negative.   Psychiatric/Behavioral: Negative.        Objective:   Physical Exam        Assessment & Plan:

## 2012-11-24 NOTE — Progress Notes (Signed)
Subjective:    Patient ID: Cheryl Wang, female    DOB: 1950/12/14, 62 y.o.   MRN: [id number]  HPI This 62 y.o. Cauc female is here for CPE. Pelvic/PAP and MMG are scheduled with her GYN- Dr. Richardean Chimera. Her main concern is her weight; she will be seeing a weight loss specialist who prescribes "natural" methods for treating obesity. She needs a battery of tests drawn prior to beginning treatment w/ him. She has had DJD /meniscal surgery and is having significant knee pian. AT present, she does not want to consider more surgery. Otherwise, she considers her health to be good.  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  HCM: PAP/MMG- as above.           Vision- annually.           CRS- 10 years ago (now due)  PMHx, Soc Hx and Fam Hx reviewed.  Review of Systems  Constitutional: Positive for unexpected weight change. Negative for diaphoresis, activity change, appetite change and fatigue.  HENT: Positive for neck pain and neck stiffness.   Eyes: Positive for visual disturbance. Negative for photophobia and pain.       With R occipital HA.  Respiratory: Negative.  Negative for cough, chest tightness and shortness of breath.   Cardiovascular: Positive for chest pain. Negative for palpitations and leg swelling.       [facility] eval for CP in July 2007. ECG- sinus brady= 59. Pt has not had cardiac evaluation.  Gastrointestinal: Negative.   Endocrine: Negative.   Musculoskeletal: Positive for arthralgias and gait problem.  Skin: Negative.   Allergic/Immunologic: Negative.   Neurological: Positive for headaches. Negative for dizziness, syncope, weakness, light-headedness and numbness.       Migraines - frontal, infrequent; has a HA located at R occiput that awakens her from sleep, is relieved by Tylenol (generic) or aspirin. No associated symptoms.  Hematological: Negative.   Psychiatric/Behavioral: Negative.        Objective:   Physical Exam  Nursing  note and vitals reviewed. Constitutional: She is oriented to person, place, and time. Vital signs are normal. She appears well-developed and well-nourished. No distress.  HENT:  Head: Normocephalic and atraumatic.  Right Ear: Hearing, tympanic membrane, external ear and ear canal normal.  Left Ear: Hearing, tympanic membrane, external ear and ear canal normal.  Nose: Nose normal. No nasal deformity or septal deviation. Right sinus exhibits no maxillary sinus tenderness and no frontal sinus tenderness. Left sinus exhibits no maxillary sinus tenderness and no frontal sinus tenderness.  Mouth/Throat: Uvula is midline, oropharynx is clear and moist and mucous membranes are normal. No oral lesions. Normal dentition. No dental caries.  Eyes: Conjunctivae, EOM and lids are normal. Pupils are equal, round, and reactive to light. No scleral icterus.  Fundoscopic exam:      The right eye shows no arteriolar narrowing, no AV nicking and no papilledema. The right eye shows red reflex.       The left eye shows no arteriolar narrowing, no AV nicking and no papilledema. The left eye shows red reflex.  Neck: Normal range of motion and full passive range of motion without pain. Neck supple. No hepatojugular reflux and no JVD present. No spinous process tenderness and no muscular tenderness present. Normal range of motion present. No mass and no thyromegaly present.  Cardiovascular: Normal rate, regular rhythm, S1 normal, S2 normal and normal pulses.   No extrasystoles are present.  PMI is not displaced.  Exam reveals no gallop, no distant heart sounds and no friction rub.   Murmur heard.  Decrescendo systolic murmur is present with a grade of 3/6   No diastolic murmur is present  Pulmonary/Chest: Effort normal and breath sounds normal. No respiratory distress.  Abdominal: Soft. Normal appearance and bowel sounds are normal. She exhibits no distension, no abdominal bruit, no pulsatile midline mass and no mass. There  is no hepatosplenomegaly. There is no tenderness. There is no guarding and no CVA tenderness. No hernia.  Genitourinary:  Deferred to GYN.  Musculoskeletal:       Right shoulder: Normal.       Left shoulder: Normal.       Right wrist: Normal.       Left wrist: Normal.       Right knee: She exhibits decreased range of motion, deformity and abnormal alignment. She exhibits no swelling, no effusion and no erythema. No tenderness found.       Left knee: She exhibits decreased range of motion and abnormal alignment. She exhibits no swelling, no effusion and no deformity. No tenderness found.       Cervical back: She exhibits spasm. She exhibits no tenderness, no bony tenderness, no deformity and no pain.       Thoracic back: Normal.       Lumbar back: Normal.       Right hand: Normal.       Left hand: Normal.  Lymphadenopathy:       Head (right side): No submental, no submandibular, no preauricular, no posterior auricular and no occipital adenopathy present.       Head (left side): No submental, no submandibular, no preauricular, no posterior auricular and no occipital adenopathy present.    She has no cervical adenopathy.       Right: No inguinal and no supraclavicular adenopathy present.       Left: No inguinal and no supraclavicular adenopathy present.  Neurological: She is alert and oriented to person, place, and time. She has normal strength and normal reflexes. She displays no atrophy. No cranial nerve deficit or sensory deficit. She exhibits normal muscle tone. Coordination and gait normal.  Skin: Skin is warm, dry and intact. No ecchymosis, no lesion and no rash noted. She is not diaphoretic. No cyanosis or erythema. No pallor. Nails show no clubbing.  Psychiatric: She has a normal mood and affect. Her speech is normal and behavior is normal. Judgment and thought content normal. Cognition and memory are normal.     Results for orders placed in visit on 11/24/12  POCT URINALYSIS  DIPSTICK      Result Value Range   Color, UA yellow     Clarity, UA clear     Glucose, UA neg     Bilirubin, UA neg     Ketones, UA neg     Spec Grav, UA 1.015     Blood, UA trace     pH, UA 5.0     Protein, UA neg     Urobilinogen, UA 0.2     Nitrite, UA neg     Leukocytes, UA Negative    POCT GLYCOSYLATED HEMOGLOBIN (HGB A1C)      Result Value Range   Hemoglobin A1C 5.4      ECG: Sinus arrhythmia; no significant ST-T wave changes. No ectopy.   [facility] reading (PRIMARY) by  Dr. Audria Nine: Cervical spine- mild degenerative changes at C3-C6. No acute changes. Muscle spasms  evident on AP view.      Assessment & Plan:  Routine general medical examination at a health care facility - Plan: POCT urinalysis dipstick, EKG 12-Lead, Insulin, random, Estradiol, Progesterone, DHEA, Testosterone, free, total, CBC with Differential, C-reactive protein, Comprehensive metabolic panel, Lipid panel, T3, free, T4, free, TSH, POCT glycosylated hemoglobin (Hb A1C)  Obesity, unspecified - Follow-up w/ weight loss specialist to start program for weight reduction. Plan: Insulin, random, Estradiol, Progesterone, DHEA, Testosterone, free, total, CBC with Differential, C-reactive protein, Comprehensive metabolic panel, Lipid panel, T3, free, T4, free, TSH, POCT glycosylated hemoglobin (Hb A1C)  Neck pain - Suspect early DDD of C-spine     Plan: DG Cervical Spine 2 or 3 views  Undiagnosed cardiac murmurs - Plan: 2D Echocardiogram without contrast  Hx of migraines  Screening for colorectal cancer - Plan: Ambulatory referral to Gastroenterology

## 2012-11-25 LAB — PROGESTERONE: Progesterone: <0.2 ng/mL

## 2012-11-25 LAB — ESTRADIOL: Estradiol: 14.5 pg/mL

## 2012-11-25 LAB — T3, FREE: T3, Free: 3 pg/mL (ref 2.3–4.2)

## 2012-11-25 LAB — T4, FREE: Free T4: 1.11 ng/dL (ref 0.80–1.80)

## 2012-11-27 LAB — TESTOSTERONE, FREE, TOTAL, SHBG
Sex Hormone Binding: 37 nmol/L (ref 18–114)
Testosterone: 42 ng/dL (ref 10–70)

## 2012-12-01 LAB — DHEA: DHEA: 109 ng/dL (ref 102–1185)

## 2012-12-02 ENCOUNTER — Encounter: Payer: Self-pay | Admitting: Family Medicine

## 2012-12-19 ENCOUNTER — Encounter: Payer: Self-pay | Admitting: Internal Medicine

## 2013-01-04 ENCOUNTER — Other Ambulatory Visit: Payer: Self-pay

## 2013-02-13 ENCOUNTER — Ambulatory Visit (AMBULATORY_SURGERY_CENTER)

## 2013-02-13 VITALS — Ht 64.0 in | Wt 198.8 lb

## 2013-02-13 DIAGNOSIS — Z1211 Encounter for screening for malignant neoplasm of colon: Secondary | ICD-10-CM

## 2013-02-13 MED ORDER — MOVIPREP 100 G PO SOLR: ORAL | Status: DC

## 2013-03-06 ENCOUNTER — Encounter: Payer: Self-pay | Admitting: Internal Medicine

## 2013-04-18 ENCOUNTER — Encounter: Payer: Self-pay | Admitting: Internal Medicine

## 2013-04-25 ENCOUNTER — Encounter: Payer: Self-pay | Admitting: Internal Medicine

## 2013-06-12 ENCOUNTER — Encounter: Payer: Self-pay | Admitting: Internal Medicine

## 2013-08-02 ENCOUNTER — Encounter: Payer: Self-pay | Admitting: Internal Medicine

## 2013-08-02 ENCOUNTER — Ambulatory Visit (AMBULATORY_SURGERY_CENTER): Admitting: Internal Medicine

## 2013-08-02 VITALS — BP 130/69 | HR 59 | Temp 97.2°F | Resp 24 | Ht 64.0 in | Wt 198.0 lb

## 2013-08-02 DIAGNOSIS — Z1211 Encounter for screening for malignant neoplasm of colon: Secondary | ICD-10-CM

## 2013-08-02 MED ORDER — SODIUM CHLORIDE 0.9 % IV SOLN: 500.0000 mL | INTRAVENOUS | Status: DC

## 2013-08-02 NOTE — Patient Instructions (Signed)
YOU HAD AN ENDOSCOPIC PROCEDURE TODAY AT THE [facility]: Refer to the procedure report that was given to you for any specific questions about what was found during the examination.  If the procedure report does not answer your questions, please call your gastroenterologist to clarify.  If you requested that your care partner not be given the details of your procedure findings, then the procedure report has been included in a sealed envelope for you to review at your convenience later.  YOU SHOULD EXPECT: Some feelings of bloating in the abdomen. Passage of more gas than usual.  Walking can help get rid of the air that was put into your GI tract during the procedure and reduce the bloating. If you had a lower endoscopy (such as a colonoscopy or flexible sigmoidoscopy) you may notice spotting of blood in your stool or on the toilet paper. If you underwent a bowel prep for your procedure, then you may not have a normal bowel movement for a few days.  DIET: Your first meal following the procedure should be a light meal and then it is ok to progress to your normal diet.  A half-sandwich or bowl of soup is an example of a good first meal.  Heavy or fried foods are harder to digest and may make you feel nauseous or bloated.  Likewise meals heavy in dairy and vegetables can cause extra gas to form and this can also increase the bloating.  Drink plenty of fluids but you should avoid alcoholic beverages for 24 hours.  ACTIVITY: Your care partner should take you home directly after the procedure.  You should plan to take it easy, moving slowly for the rest of the day.  You can resume normal activity the day after the procedure however you should NOT DRIVE or use heavy machinery for 24 hours (because of the sedation medicines used during the test).    SYMPTOMS TO REPORT IMMEDIATELY: A gastroenterologist can be reached at any hour.  During normal business hours, 8:30 AM to 5:00 PM Monday through Friday,  call [phone].  After hours and on weekends, please call the GI answering service at [phone] who will take a message and have the physician on call contact you.   Following lower endoscopy (colonoscopy or flexible sigmoidoscopy):  Excessive amounts of blood in the stool  Significant tenderness or worsening of abdominal pains  Swelling of the abdomen that is new, acute  Fever of 100F or higher   FOLLOW UP: If any biopsies were taken you will be contacted by phone or by letter within the next 1-3 weeks.  Call your gastroenterologist if you have not heard about the biopsies in 3 weeks.  Our staff will call the home number listed on your records the next business day following your procedure to check on you and address any questions or concerns that you may have at that time regarding the information given to you following your procedure. This is a courtesy call and so if there is no answer at the home number and we have not heard from you through the emergency physician on call, we will assume that you have returned to your regular daily activities without incident.  SIGNATURES/CONFIDENTIALITY: You and/or your care partner have signed paperwork which will be entered into your electronic medical record.  These signatures attest to the fact that that the information above on your After Visit Summary has been reviewed and is understood.  Full responsibility of the confidentiality of   this discharge information lies with you and/or your care-partner.    Handout was given to your care partner on a high fiber diet with liberal fluid intake. Yo u may resume your current medications today.. Please call if any questions or concerns.   

## 2013-08-02 NOTE — Progress Notes (Signed)
No complaints noted in the recovery room. Maw   

## 2013-08-02 NOTE — Op Note (Signed)
[facility] [address] [place], [address]   COLONOSCOPY PROCEDURE REPORT  PATIENT: Cheryl Wang, Cheryl Wang  MR#: [id number] BIRTHDATE: 1950-06-18 , 63  yrs. old GENDER: Female ENDOSCOPIST: Hart Carwin, MD REFERRED LS:LHTDSKA McPherson, M.D., Dr Arelia Sneddon PROCEDURE DATE:  08/02/2013 PROCEDURE:   Colonoscopy, screening First Screening Colonoscopy - Avg.  risk and is 50 yrs.  old or older - No.  Prior Negative Screening - Now for repeat screening. 10 or more years since last screening  History of Adenoma - Now for follow-up colonoscopy & has been > or = to 3 yrs.  N/A  Polyps Removed Today? No.  Recommend repeat exam, <10 yrs? No. ASA CLASS:   Class I INDICATIONS:Average risk patient for colon cancer and last colonoscopy January 2005 was normal. MEDICATIONS: MAC sedation, administered by CRNA and Propofol (Diprivan) 230 mg IV  DESCRIPTION OF PROCEDURE:   After the risks benefits and alternatives of the procedure were thoroughly explained, informed consent was obtained.  A digital rectal exam revealed no abnormalities of the rectum.   The LB [id number]-[id number]  endoscope was introduced through the anus and advanced to the cecum, which was identified by both the appendix and ileocecal valve. No adverse events experienced.   The quality of the prep was good, using MoviPrep  The instrument was then slowly withdrawn as the colon was fully examined.      COLON FINDINGS: A normal appearing cecum, ileocecal valve, and appendiceal orifice were identified.  The ascending, hepatic flexure, transverse, splenic flexure, descending, sigmoid colon and rectum appeared unremarkable.  No polyps or cancers were seen. Retroflexed views revealed no abnormalities. The time to cecum=5 minutes 23 seconds.  Withdrawal time=7 minutes 50 seconds.  The scope was withdrawn and the procedure completed. COMPLICATIONS: There were no complications.  ENDOSCOPIC IMPRESSION: Normal  colon  RECOMMENDATIONS: high fiber diet Recall colonoscopy in 10 years   eSigned:  Hart Carwin, MD 08/02/2013 10:03 AM   cc:

## 2013-08-02 NOTE — Progress Notes (Signed)
Report to PACU, RN, vss, BBS= Clear.  

## 2013-08-03 ENCOUNTER — Telehealth: Payer: Self-pay | Admitting: *Deleted

## 2013-08-03 NOTE — Telephone Encounter (Signed)
  Follow up Call-  Call back number 08/02/2013  Post procedure Call Back phone  # [phone]  Permission to leave phone message Yes     Patient questions:  Do you have a fever, pain , or abdominal swelling? no Pain Score  0 *  Have you tolerated food without any problems? yes  Have you been able to return to your normal activities? yes  Do you have any questions about your discharge instructions: Diet   no Medications  no Follow up visit  no  Do you have questions or concerns about your Care? no  Actions: * If pain score is 4 or above: No action needed, pain <4.

## 2014-01-31 ENCOUNTER — Other Ambulatory Visit: Payer: Self-pay | Admitting: Obstetrics and Gynecology

## 2014-02-01 LAB — CYTOLOGY - PAP

## 2014-04-11 ENCOUNTER — Ambulatory Visit (INDEPENDENT_AMBULATORY_CARE_PROVIDER_SITE_OTHER): Admitting: Family Medicine

## 2014-04-11 ENCOUNTER — Encounter: Payer: Self-pay | Admitting: Family Medicine

## 2014-04-11 VITALS — BP 164/78 | HR 71 | Temp 97.5°F | Resp 16 | Ht 63.5 in | Wt 208.0 lb

## 2014-04-11 DIAGNOSIS — M179 Osteoarthritis of knee, unspecified: Secondary | ICD-10-CM

## 2014-04-11 DIAGNOSIS — Z23 Encounter for immunization: Secondary | ICD-10-CM

## 2014-04-11 DIAGNOSIS — Z1159 Encounter for screening for other viral diseases: Secondary | ICD-10-CM

## 2014-04-11 DIAGNOSIS — Z0000 Encounter for general adult medical examination without abnormal findings: Secondary | ICD-10-CM

## 2014-04-11 DIAGNOSIS — M1712 Unilateral primary osteoarthritis, left knee: Secondary | ICD-10-CM

## 2014-04-11 DIAGNOSIS — E669 Obesity, unspecified: Secondary | ICD-10-CM

## 2014-04-11 MED ORDER — ZOSTER VACCINE LIVE 19400 UNT/0.65ML ~~LOC~~ SOLR: 0.6500 mL | Freq: Once | SUBCUTANEOUS | Status: DC

## 2014-04-11 NOTE — Patient Instructions (Addendum)
Keeping You Healthy  Get These Tests  Blood Pressure- Have your blood pressure checked by your healthcare provider at least once a year.  Normal blood pressure is 120/80.  Weight- Have your body mass index (BMI) calculated to screen for obesity.  BMI is a measure of body fat based on height and weight.  You can calculate your own BMI at www.nhlbisupport.com/bmi/  Cholesterol- Have your cholesterol checked every year.  Diabetes- Have your blood sugar checked every year if you have high blood pressure, high cholesterol, a family history of diabetes or if you are overweight.  Pap Smear- Have a pap smear every 1 to 3 years if you have been sexually active.  If you are older than 65 and recent pap smears have been normal you may not need additional pap smears.  In addition, if you have had a hysterectomy  For benign disease additional pap smears are not necessary.  Mammogram-Yearly mammograms are essential for early detection of breast cancer  Screening for Colon Cancer- Colonoscopy starting at age 50. Screening may begin sooner depending on your family history and other health conditions.  Follow up colonoscopy as directed by your Gastroenterologist.  Screening for Osteoporosis- Screening begins at age 65 with bone density scanning, sooner if you are at higher risk for developing Osteoporosis.  Get these medicines  Calcium with Vitamin D- Your body requires 1200-1500 mg of Calcium a day and 800-1000 IU of Vitamin D a day.  You can only absorb 500 mg of Calcium at a time therefore Calcium must be taken in 2 or 3 separate doses throughout the day.  Hormones- Hormone therapy has been associated with increased risk for certain cancers and heart disease.  Talk to your healthcare provider about if you need relief from menopausal symptoms.  Aspirin- Ask your healthcare provider about taking Aspirin to prevent Heart Disease and Stroke.  Get these Immuniztions  Flu shot- Every fall  Pneumonia  shot- Once after the age of 65; if you are younger ask your healthcare provider if you need a pneumonia shot.  Tetanus- Every ten years.  Zostavax- Once after the age of 60 to prevent shingles.  Take these steps  Don't smoke- Your healthcare provider can help you quit. For tips on how to quit, ask your healthcare provider or go to www.smokefree.gov or call 1-800 QUIT-NOW.  Be physically active- Exercise 5 days a week for a minimum of 30 minutes.  If you are not already physically active, start slow and gradually work up to 30 minutes of moderate physical activity.  Try walking, dancing, bike riding, swimming, etc.  Eat a healthy diet- Eat a variety of healthy foods such as fruits, vegetables, whole grains, low fat milk, low fat cheeses, yogurt, lean meats, chicken, fish, eggs, dried beans, tofu, etc.  For more information go to www.thenutritionsource.org  Dental visit- Brush and floss teeth twice daily; visit your dentist twice a year.  Eye exam- Visit your Optometrist or Ophthalmologist yearly.  Drink alcohol in moderation- Limit alcohol intake to one drink or less a day.  Never drink and drive.  Depression- Your emotional health is as important as your physical health.  If you're feeling down or losing interest in things you normally enjoy, please talk to your healthcare provider.  Seat Belts- can save your life; always wear one  Smoke/Carbon Monoxide detectors- These detectors need to be installed on the appropriate level of your home.  Replace batteries at least once a year.  Violence- If anyone   is threatening or hurting you, please tell your healthcare provider.  Living Will/ Health care power of attorney- Discuss with your healthcare provider and family.    For consideration of knee surgery- Dr. Georgena SpurlingStephen Lucey  [phone].        Mediterranean Diet  Why follow it? Research shows. . Those who follow the Mediterranean diet have a reduced risk of heart disease  . The diet  is associated with a reduced incidence of Parkinson's and Alzheimer's diseases . People following the diet may have longer life expectancies and lower rates of chronic diseases  . The Dietary Guidelines for Americans recommends the Mediterranean diet as an eating plan to promote health and prevent disease  What Is the Mediterranean Diet?  . Healthy eating plan based on typical foods and recipes of Mediterranean-style cooking . The diet is primarily a plant based diet; these foods should make up a majority of meals   Starches - Plant based foods should make up a majority of meals - They are an important sources of vitamins, minerals, energy, antioxidants, and fiber - Choose whole grains, foods high in fiber and minimally processed items  - Typical grain sources include wheat, oats, barley, corn, brown rice, bulgar, farro, millet, polenta, couscous  - Various types of beans include chickpeas, lentils, fava beans, black beans, white beans   Fruits  Veggies - Large quantities of antioxidant rich fruits & veggies; 6 or more servings  - Vegetables can be eaten raw or lightly drizzled with oil and cooked  - Vegetables common to the traditional Mediterranean Diet include: artichokes, arugula, beets, broccoli, brussel sprouts, cabbage, carrots, celery, collard greens, cucumbers, eggplant, kale, leeks, lemons, lettuce, mushrooms, okra, onions, peas, peppers, potatoes, pumpkin, radishes, rutabaga, shallots, spinach, sweet potatoes, turnips, zucchini - Fruits common to the Mediterranean Diet include: apples, apricots, avocados, cherries, clementines, dates, figs, grapefruits, grapes, melons, nectarines, oranges, peaches, pears, pomegranates, strawberries, tangerines  Fats - Replace butter and margarine with healthy oils, such as olive oil, canola oil, and tahini  - Limit nuts to no more than a handful a day  - Nuts include walnuts, almonds, pecans, pistachios, pine nuts  - Limit or avoid candied, honey  roasted or heavily salted nuts - Olives are central to the [employer]iterranean diet - can be eaten whole or used in a variety of dishes   Meats Protein - Limiting red meat: no more than a few times a month - When eating red meat: choose lean cuts and keep the portion to the size of deck of cards - Eggs: approx. 0 to 4 times a week  - Fish and lean poultry: at least 2 a week  - Healthy protein sources include, chicken, [place], lean beef, lamb - Increase intake of seafood such as tuna, salmon, trout, mackerel, shrimp, scallops - Avoid or limit high fat processed meats such as sausage and bacon  Dairy - Include moderate amounts of low fat dairy products  - Focus on healthy dairy such as fat free yogurt, skim milk, low or reduced fat cheese - Limit dairy products higher in fat such as whole or 2% milk, cheese, ice cream  Alcohol - Moderate amounts of red wine is ok  - No more than 5 oz daily for women (all ages) and men older than age 62  - No more than 10 oz of wine daily for men younger than 4265  Other - Limit sweets and other desserts  - Use herbs and spices instead of salt to flavor  foods  - Herbs and spices common to the traditional Mediterranean Diet include: basil, bay leaves, chives, cloves, cumin, fennel, garlic, lavender, marjoram, mint, oregano, parsley, pepper, rosemary, sage, savory, sumac, tarragon, thyme   It's not just a diet, it's a lifestyle:  . The Mediterranean diet includes lifestyle factors typical of those in the region  . Foods, drinks and meals are best eaten with others and savored . Daily physical activity is important for overall good health . This could be strenuous exercise like running and aerobics . This could also be more leisurely activities such as walking, housework, yard-work, or taking the stairs . Moderation is the key; a balanced and healthy diet accommodates most foods and drinks . Consider portion sizes and frequency of consumption of certain foods   Meal  Ideas & Options:  . Breakfast:  o Whole wheat toast or whole wheat English muffins with peanut butter & hard boiled egg o Steel cut oats topped with apples & cinnamon and skim milk  o Fresh fruit: banana, strawberries, melon, berries, peaches  o Smoothies: strawberries, bananas, greek yogurt, peanut butter o Low fat greek yogurt with blueberries and granola  o Egg white omelet with spinach and mushrooms o Breakfast couscous: whole wheat couscous, apricots, skim milk, cranberries  . Sandwiches:  o Hummus and grilled vegetables (peppers, zucchini, squash) on whole wheat bread   o Grilled chicken on whole wheat pita with lettuce, tomatoes, cucumbers or tzatziki  o Tuna salad on whole wheat bread: tuna salad made with greek yogurt, olives, red peppers, capers, green onions o Garlic rosemary lamb pita: lamb sauted with garlic, rosemary, salt & pepper; add lettuce, cucumber, greek yogurt to pita - flavor with lemon juice and black pepper  . Seafood:  o Mediterranean grilled salmon, seasoned with garlic, basil, parsley, lemon juice and black pepper o Shrimp, lemon, and spinach whole-grain pasta salad made with low fat greek yogurt  o Seared scallops with lemon orzo  o Seared tuna steaks seasoned salt, pepper, coriander topped with tomato mixture of olives, tomatoes, olive oil, minced garlic, parsley, green onions and cappers  . Meats:  o Herbed greek chicken salad with kalamata olives, cucumber, feta  o Red bell peppers stuffed with spinach, bulgur, lean ground beef (or lentils) & topped with feta   o Kebabs: skewers of chicken, tomatoes, onions, zucchini, squash  o [place] burgers: made with red onions, mint, dill, lemon juice, feta cheese topped with roasted red peppers . Vegetarian o Cucumber salad: cucumbers, artichoke hearts, celery, red onion, feta cheese, tossed in olive oil & lemon juice  o Hummus and whole grain pita points with a greek salad (lettuce, tomato, feta, olives, cucumbers, red  onion) o Lentil soup with celery, carrots made with vegetable broth, garlic, salt and pepper  o Tabouli salad: parsley, bulgur, mint, scallions, cucumbers, tomato, radishes, lemon juice, olive oil, salt and pepper.    There is a weight loss center in [place] that you might want to check out called [facility].

## 2014-04-11 NOTE — Progress Notes (Signed)
Subjective:    Patient ID: Cheryl Wang, female    DOB: 04-Sep-1950, 64 y.o.   MRN: [id number]  HPI  This 64 y.o. Female is here for CPE, last examined by me in 2014. Her health is good but she has chronic knee OA treated by Dr. Thurston Hole. Left knee pain and decreased ROM limits her activity and her gait is abnormal. Pt thinks she is too young for knee replacement; she has been advised that when her pain adversely affects her quality of life, then she should consider surgery. She is almost at that point. She is retired and helps care for her grandchildren.  HCM: MMG- Current per pt.           PAP- NA (s/p Vag Hyst).           CRS- Current (2015) w/ 10-yr recall.           IMM- Pt reports Tdap current; declines Flu vaccine. Requests Zostavax.           Vision- Annually.           Dental- Current.   Patient Active Problem List   Diagnosis Date Noted  . Hx of migraines 11/24/2012  . Left knee DJD 11/24/2012  . Right knee DJD 11/24/2012  . Obesity, unspecified 11/24/2012  . Neck pain 11/24/2012    Prior to Admission medications   Medication Sig Start Date End Date Taking? Authorizing Provider  Naproxen Sodium (ALEVE PO) Take by mouth as needed.   Yes Historical Provider, MD           Past Surgical History  Procedure Laterality Date  . Knee surgery      Bil  . Cyst removal  10/1988    on chest   . Abdominal hysterectomy  2002    VAG HYST-  . Knee arthroscopy  1995    RIGHT AND LEFT  . Colonoscopy    . Tonsillectomy    . Carpal tunnel release Left 08/30/2012    Procedure: CARPAL TUNNEL RELEASE;  Surgeon: Nicki Reaper, MD;  Location: [facility];  Service: Orthopedics;  Laterality: Left;  . Trigger finger release Left 08/30/2012    Procedure: STS RELEASE LEFT THUMB;  Surgeon: Nicki Reaper, MD;  Location:  SURGERY CENTER;  Service: Orthopedics;  Laterality: Left;    History   Social History  . Marital Status: Married    Spouse Name: N/A  . Number of  Children: N/A  . Years of Education: N/A   Occupational History  . Retired.   Social History Main Topics  . Smoking status: Former Smoker    Types: Cigarettes    Quit date: 08/26/1978  . Smokeless tobacco: Never Used  . Alcohol Use: No     Comment: OCC  . Drug Use: No  . Sexual Activity: Yes   Other Topics Concern  . Not on file   Social History Narrative   Married   College   Does not work   Research officer, political party for exercise 2x's weekly.   Family History  Problem Relation Age of Onset  . Anorectal malformation Son   . Heart disease Brother   . Hyperlipidemia Father     Review of Systems  Constitutional: Positive for activity change.  HENT: Negative.   Eyes: Negative.   Respiratory: Negative.   Cardiovascular: Negative.   Gastrointestinal: Negative.   Endocrine: Negative.   Genitourinary: Negative.   Musculoskeletal: Positive for joint swelling and arthralgias.  Skin:  Negative.   Allergic/Immunologic: Negative.   Neurological: Negative.   Hematological: Negative.   Psychiatric/Behavioral: Negative.       Objective:   Physical Exam  Constitutional: She is oriented to person, place, and time. Vital signs are normal. She appears well-developed and well-nourished. No distress.  Blood pressure 164/78, pulse 71, temperature 97.5 F (36.4 C), temperature source Oral, resp. rate 16, height 5' 3.5" (1.613 m), weight 208 lb (94.348 kg), SpO2 100 %. BP recheck:  138/90.  HENT:  Head: Normocephalic and atraumatic.  Right Ear: Hearing, tympanic membrane, external ear and ear canal normal.  Left Ear: Hearing, tympanic membrane, external ear and ear canal normal.  Nose: Nose normal. No mucosal edema, nasal deformity or septal deviation.  Mouth/Throat: Uvula is midline, oropharynx is clear and moist and mucous membranes are normal. No oral lesions. Normal dentition. No dental caries.  Eyes: Conjunctivae, EOM and lids are normal. Pupils are equal, round, and reactive to light. No scleral  icterus.  Neck: Trachea normal, normal range of motion, full passive range of motion without pain and phonation normal. Neck supple. No JVD present. No spinous process tenderness and no muscular tenderness present. Carotid bruit is not present. No thyroid mass and no thyromegaly present.  Cardiovascular: Normal rate, regular rhythm, S1 normal, S2 normal, normal heart sounds, intact distal pulses and normal pulses.   No extrasystoles are present. PMI is not displaced.  Exam reveals no gallop and no friction rub.   No murmur heard. Pulmonary/Chest: Effort normal and breath sounds normal. No respiratory distress.  Abdominal: Soft. Normal appearance and bowel sounds are normal. She exhibits no distension, no abdominal bruit, no pulsatile midline mass and no mass. There is no hepatosplenomegaly. There is no tenderness. There is no guarding and no CVA tenderness.  Genitourinary:  Deferred.  Musculoskeletal:       Right knee: She exhibits decreased range of motion and deformity. She exhibits no swelling, no ecchymosis and no erythema. Tenderness found.       Left knee: She exhibits decreased range of motion, swelling, deformity and abnormal alignment. She exhibits no effusion and no erythema. Tenderness found. Medial joint line tenderness noted.       Cervical back: Normal.       Thoracic back: Normal.       Lumbar back: Normal.  Remainder of exam unremarkable.  Lymphadenopathy:       Head (right side): No submental, no submandibular, no tonsillar, no preauricular, no posterior auricular and no occipital adenopathy present.       Head (left side): No submental, no submandibular, no tonsillar, no preauricular, no posterior auricular and no occipital adenopathy present.    She has no cervical adenopathy.       Right: No supraclavicular adenopathy present.       Left: No supraclavicular adenopathy present.  Neurological: She is alert and oriented to person, place, and time. She has normal strength and  normal reflexes. She displays no atrophy. No cranial nerve deficit or sensory deficit. She exhibits normal muscle tone. She displays a negative Romberg sign. Gait abnormal. Coordination normal.  Antalgic gait; decreased lower ext (quads) strength.  Skin: Skin is warm, dry and intact. No ecchymosis, no lesion and no rash noted. She is not diaphoretic. No cyanosis or erythema. No pallor. Nails show no clubbing.  Psychiatric: She has a normal mood and affect. Her speech is normal and behavior is normal. Judgment and thought content normal. Cognition and memory are normal.  Nursing note  and vitals reviewed.   ECG:  NSR; no ST-TW changes. No ectopy.     Assessment & Plan:  Well woman exam (no gynecological exam) - Plan: EKG 12-Lead, Hepatitis C antibody, Vit D  25 hydroxy (rtn osteoporosis monitoring), COMPLETE METABOLIC PANEL WITH GFR, CBC with Differential/Platelet  Osteoarthritis of left knee, unspecified osteoarthritis type - Lengthy conversation about merits of proceeding w/ knee surgery while she is in good health and has no contra-indications to surgery. She is limited in activities and quality of life is adversely impacted. She wants a second opinion and I provided Dr. Jeannett Senior Lucey's name and number. Pt will sch appt to discuss procedure of he accepts her insurance. Plan: Vit D  25 hydroxy (rtn osteoporosis monitoring)  Need for hepatitis C screening test - Plan: Hepatitis C antibody  Obesity - Pt acknowledges knowledge of lifestyle changes but activity is severely limited by OA of L knee. She agrees it will be difficult to reduce weight if she cannot exercise. Plan: COMPLETE METABOLIC PANEL WITH GFR  Need for shingles vaccine - Plan: zoster vaccine live, PF, (ZOSTAVAX) 16109 UNT/0.65ML injection

## 2014-04-12 LAB — CBC WITH DIFFERENTIAL/PLATELET
BASOS PCT: 0 % (ref 0–1)
Basophils Absolute: 0 10*3/uL (ref 0.0–0.1)
EOS PCT: 2 % (ref 0–5)
Eosinophils Absolute: 0.1 10*3/uL (ref 0.0–0.7)
HCT: 41 % (ref 36.0–46.0)
HEMOGLOBIN: 13.7 g/dL (ref 12.0–15.0)
Lymphocytes Relative: 20 % (ref 12–46)
Lymphs Abs: 1.4 10*3/uL (ref 0.7–4.0)
MCH: 28.8 pg (ref 26.0–34.0)
MCHC: 33.4 g/dL (ref 30.0–36.0)
MCV: 86.3 fL (ref 78.0–100.0)
MPV: 8.9 fL (ref 8.6–12.4)
Monocytes Absolute: 0.7 10*3/uL (ref 0.1–1.0)
Monocytes Relative: 10 % (ref 3–12)
NEUTROS ABS: 4.6 10*3/uL (ref 1.7–7.7)
Neutrophils Relative %: 68 % (ref 43–77)
Platelets: 305 10*3/uL (ref 150–400)
RBC: 4.75 MIL/uL (ref 3.87–5.11)
RDW: 13.9 % (ref 11.5–15.5)
WBC: 6.8 10*3/uL (ref 4.0–10.5)

## 2014-04-12 LAB — COMPLETE METABOLIC PANEL WITH GFR
ALK PHOS: 48 U/L (ref 39–117)
ALT: 29 U/L (ref 0–35)
AST: 21 U/L (ref 0–37)
Albumin: 4 g/dL (ref 3.5–5.2)
BUN: 15 mg/dL (ref 6–23)
CALCIUM: 9.2 mg/dL (ref 8.4–10.5)
CO2: 25 meq/L (ref 19–32)
Chloride: 108 mEq/L (ref 96–112)
Creat: 0.63 mg/dL (ref 0.50–1.10)
GFR, Est African American: >89 mL/min
GFR, Est Non African American: >89 mL/min
Glucose, Bld: 83 mg/dL (ref 70–99)
Potassium: 4.5 mEq/L (ref 3.5–5.3)
Sodium: 142 mEq/L (ref 135–145)
TOTAL PROTEIN: 6.1 g/dL (ref 6.0–8.3)
Total Bilirubin: 0.4 mg/dL (ref 0.2–1.2)

## 2014-04-12 LAB — VITAMIN D 25 HYDROXY (VIT D DEFICIENCY, FRACTURES): VIT D 25 HYDROXY: 29 ng/mL — AB (ref 30–100)

## 2014-04-12 LAB — HEPATITIS C ANTIBODY: HCV Ab: NEGATIVE

## 2014-04-16 ENCOUNTER — Telehealth: Payer: Self-pay

## 2014-04-16 NOTE — Telephone Encounter (Signed)
PT DROPPED A FORM FOR ORTHO SURGICAL CLEARANCE THIS FOR[facility]

## 2014-04-24 ENCOUNTER — Telehealth: Payer: Self-pay

## 2014-04-24 ENCOUNTER — Encounter: Payer: Self-pay | Admitting: Family Medicine

## 2014-04-24 NOTE — Telephone Encounter (Signed)
Left message to inform patient her form for medical clearance has been completed and faxed per her request and is ready to be picked up.

## 2014-04-24 NOTE — Telephone Encounter (Signed)
Pre-operative clearance letter has been completed and pt can come pick up forms.

## 2014-04-29 ENCOUNTER — Other Ambulatory Visit: Payer: Self-pay | Admitting: Orthopedic Surgery

## 2014-05-03 ENCOUNTER — Ambulatory Visit (HOSPITAL_COMMUNITY)
Admission: RE | Admit: 2014-05-03 | Discharge: 2014-05-03 | Disposition: A | Discharge status: Home / Self Care | Source: Ambulatory Visit | Attending: Orthopedic Surgery | Admitting: Orthopedic Surgery

## 2014-05-03 ENCOUNTER — Other Ambulatory Visit (HOSPITAL_COMMUNITY): Payer: Self-pay | Admitting: *Deleted

## 2014-05-03 ENCOUNTER — Encounter (HOSPITAL_COMMUNITY): Payer: Self-pay

## 2014-05-03 ENCOUNTER — Encounter (HOSPITAL_COMMUNITY)
Admission: RE | Admit: 2014-05-03 | Discharge: 2014-05-03 | Disposition: A | Discharge status: Home / Self Care | Source: Ambulatory Visit | Attending: Orthopedic Surgery | Admitting: Orthopedic Surgery

## 2014-05-03 DIAGNOSIS — M1712 Unilateral primary osteoarthritis, left knee: Secondary | ICD-10-CM | POA: Diagnosis not present

## 2014-05-03 DIAGNOSIS — Z01818 Encounter for other preprocedural examination: Secondary | ICD-10-CM | POA: Diagnosis present

## 2014-05-03 HISTORY — DX: Dermatitis, unspecified: L30.9

## 2014-05-03 HISTORY — DX: Unspecified asthma, uncomplicated: J45.909

## 2014-05-03 LAB — CBC WITH DIFFERENTIAL/PLATELET
BASOS ABS: 0 10*3/uL (ref 0.0–0.1)
BASOS PCT: 1 % (ref 0–1)
Eosinophils Absolute: 0.2 10*3/uL (ref 0.0–0.7)
Eosinophils Relative: 2 % (ref 0–5)
HCT: 43.2 % (ref 36.0–46.0)
Hemoglobin: 14.6 g/dL (ref 12.0–15.0)
LYMPHS PCT: 17 % (ref 12–46)
Lymphs Abs: 1.4 10*3/uL (ref 0.7–4.0)
MCH: 29.4 pg (ref 26.0–34.0)
MCHC: 33.8 g/dL (ref 30.0–36.0)
MCV: 87.1 fL (ref 78.0–100.0)
Monocytes Absolute: 1 10*3/uL (ref 0.1–1.0)
Monocytes Relative: 12 % (ref 3–12)
NEUTROS ABS: 5.7 10*3/uL (ref 1.7–7.7)
Neutrophils Relative %: 68 % (ref 43–77)
PLATELETS: 336 10*3/uL (ref 150–400)
RBC: 4.96 MIL/uL (ref 3.87–5.11)
RDW: 13.2 % (ref 11.5–15.5)
WBC: 8.3 10*3/uL (ref 4.0–10.5)

## 2014-05-03 LAB — COMPREHENSIVE METABOLIC PANEL
ALK PHOS: 52 U/L (ref 39–117)
ALT: 21 U/L (ref 0–35)
ANION GAP: 8 (ref 5–15)
AST: 26 U/L (ref 0–37)
Albumin: 4.1 g/dL (ref 3.5–5.2)
BILIRUBIN TOTAL: 0.5 mg/dL (ref 0.3–1.2)
BUN: 15 mg/dL (ref 6–23)
CALCIUM: 9.4 mg/dL (ref 8.4–10.5)
CO2: 25 mmol/L (ref 19–32)
Chloride: 107 mmol/L (ref 96–112)
Creatinine, Ser: 0.77 mg/dL (ref 0.50–1.10)
GFR calc non Af Amer: 89 mL/min — ABNORMAL LOW (ref >90)
GLUCOSE: 117 mg/dL — AB (ref 70–99)
POTASSIUM: 3.7 mmol/L (ref 3.5–5.1)
SODIUM: 140 mmol/L (ref 135–145)
TOTAL PROTEIN: 6.6 g/dL (ref 6.0–8.3)

## 2014-05-03 LAB — SURGICAL PCR SCREEN
MRSA, PCR: NEGATIVE
Staphylococcus aureus: NEGATIVE

## 2014-05-03 LAB — URINALYSIS, ROUTINE W REFLEX MICROSCOPIC
Bilirubin Urine: NEGATIVE
Glucose, UA: NEGATIVE mg/dL
Hgb urine dipstick: NEGATIVE
Ketones, ur: 15 mg/dL — AB
Leukocytes, UA: NEGATIVE
Nitrite: NEGATIVE
Protein, ur: NEGATIVE mg/dL
UROBILINOGEN UA: 0.2 mg/dL (ref 0.0–1.0)
pH: 5 (ref 5.0–8.0)

## 2014-05-03 LAB — PROTIME-INR
INR: 0.97 (ref 0.00–1.49)
PROTHROMBIN TIME: 12.9 s (ref 11.6–15.2)

## 2014-05-03 LAB — APTT: aPTT: 31 seconds (ref 24–37)

## 2014-05-03 NOTE — Pre-Procedure Instructions (Signed)
Cheryl Wang  05/03/2014   Your procedure is scheduled on:  Monday, May 13, 2014 at 8:45 AM.   Report to [facility] Entrance "A" Admitting Office at 6:45 AM.   Call this number if you have problems the morning of surgery: [phone]               Any questions prior to day of surgery, please call [phone] between 8 & 4 PM.   Remember:   Do not eat food or drink liquids after midnight Sunday, 05/12/14.   Take these medicines the morning of surgery with A SIP OF WATER: None  Stop Vitamins, Herbal medications and NSAIDS (Ibuprofen, Aleve, Etc.) as of Monday, 05/06/14.   Do not wear jewelry, make-up or nail polish.  Do not wear lotions, powders, or perfumes. You may wear deodorant.  Do not shave 48 hours prior to surgery.   Do not bring valuables to the hospital.  [facility] is not responsible                  for any belongings or valuables.               Contacts, dentures or bridgework may not be worn into surgery.  Leave suitcase in the car. After surgery it may be brought to your room.  For patients admitted to the hospital, discharge time is determined by your                treatment team.               Special Instructions: [place] - Preparing for Surgery  Before surgery, you can play an important role.  Because skin is not sterile, your skin needs to be as free of germs as possible.  You can reduce the number of germs on you skin by washing with CHG (chlorahexidine gluconate) soap before surgery.  CHG is an antiseptic cleaner which kills germs and bonds with the skin to continue killing germs even after washing.  Please DO NOT use if you have an allergy to CHG or antibacterial soaps.  If your skin becomes reddened/irritated stop using the CHG and inform your nurse when you arrive at Short Stay.  Do not shave (including legs and underarms) for at least 48 hours prior to the first CHG shower.  You may shave your face.  Please follow these instructions  carefully:   1.  Shower with CHG Soap the night before surgery and the                                morning of Surgery.  2.  If you choose to wash your hair, wash your hair first as usual with your       normal shampoo.  3.  After you shampoo, rinse your hair and body thoroughly to remove the                      Shampoo.  4.  Use CHG as you would any other liquid soap.  You can apply chg directly       to the skin and wash gently with scrungie or a clean washcloth.  5.  Apply the CHG Soap to your body ONLY FROM THE NECK DOWN.        Do not use on open wounds or open sores.  Avoid contact with your eyes, ears,  mouth and genitals (private parts).  Wash genitals (private parts) with your normal soap.  6.  Wash thoroughly, paying special attention to the area where your surgery        will be performed.  7.  Thoroughly rinse your body with warm water from the neck down.  8.  DO NOT shower/wash with your normal soap after using and rinsing off       the CHG Soap.  9.  Pat yourself dry with a clean towel.            10.  Wear clean pajamas.            11.  Place clean sheets on your bed the night of your first shower and do not        sleep with pets.  Day of Surgery  Do not apply any lotions the morning of surgery.  Please wear clean clothes to the hospital.     Please read over the following fact sheets that you were given: Pain Booklet, Coughing and Deep Breathing, MRSA Information and Surgical Site Infection Prevention

## 2014-05-05 LAB — URINE CULTURE
COLONY COUNT: NO GROWTH
Culture: NO GROWTH

## 2014-05-12 MED ORDER — BUPIVACAINE LIPOSOME 1.3 % IJ SUSP
20.0000 mL | Freq: Once | INTRAMUSCULAR | Status: DC
  Filled 2014-05-12: qty 20

## 2014-05-12 MED ORDER — VANCOMYCIN HCL IN DEXTROSE 1-5 GM/200ML-% IV SOLN
1000.0000 mg | INTRAVENOUS | Status: DC
  Filled 2014-05-12: qty 200

## 2014-05-12 MED ORDER — TRANEXAMIC ACID 100 MG/ML IV SOLN
1000.0000 mg | INTRAVENOUS | Status: AC
  Administered 2014-05-13: 1000 mg via INTRAVENOUS
  Filled 2014-05-12: qty 10

## 2014-05-13 ENCOUNTER — Inpatient Hospital Stay (HOSPITAL_COMMUNITY)
Admission: RE | Admit: 2014-05-13 | Discharge: 2014-05-14 | DRG: 470 | Disposition: A | Discharge status: Home Health | Source: Ambulatory Visit | Attending: Orthopedic Surgery | Admitting: Orthopedic Surgery

## 2014-05-13 ENCOUNTER — Inpatient Hospital Stay (HOSPITAL_COMMUNITY): Admitting: Anesthesiology

## 2014-05-13 ENCOUNTER — Encounter (HOSPITAL_COMMUNITY)
Admission: RE | Disposition: A | Discharge status: Home Health | Payer: Self-pay | Source: Ambulatory Visit | Attending: Orthopedic Surgery

## 2014-05-13 ENCOUNTER — Encounter (HOSPITAL_COMMUNITY): Payer: Self-pay | Admitting: Certified Registered Nurse Anesthetist

## 2014-05-13 DIAGNOSIS — Z7901 Long term (current) use of anticoagulants: Secondary | ICD-10-CM | POA: Diagnosis not present

## 2014-05-13 DIAGNOSIS — Z87891 Personal history of nicotine dependence: Secondary | ICD-10-CM | POA: Diagnosis not present

## 2014-05-13 DIAGNOSIS — Z96659 Presence of unspecified artificial knee joint: Secondary | ICD-10-CM

## 2014-05-13 DIAGNOSIS — M1712 Unilateral primary osteoarthritis, left knee: Secondary | ICD-10-CM | POA: Diagnosis present

## 2014-05-13 DIAGNOSIS — Z79899 Other long term (current) drug therapy: Secondary | ICD-10-CM

## 2014-05-13 DIAGNOSIS — E6609 Other obesity due to excess calories: Secondary | ICD-10-CM | POA: Diagnosis present

## 2014-05-13 DIAGNOSIS — M25562 Pain in left knee: Secondary | ICD-10-CM | POA: Diagnosis present

## 2014-05-13 DIAGNOSIS — Z8249 Family history of ischemic heart disease and other diseases of the circulatory system: Secondary | ICD-10-CM | POA: Diagnosis not present

## 2014-05-13 DIAGNOSIS — D62 Acute posthemorrhagic anemia: Secondary | ICD-10-CM | POA: Diagnosis not present

## 2014-05-13 DIAGNOSIS — Z880 Allergy status to penicillin: Secondary | ICD-10-CM | POA: Diagnosis not present

## 2014-05-13 HISTORY — PX: TOTAL KNEE ARTHROPLASTY: SHX125

## 2014-05-13 LAB — CBC
HCT: 41 % (ref 36.0–46.0)
Hemoglobin: 13.6 g/dL (ref 12.0–15.0)
MCH: 29.2 pg (ref 26.0–34.0)
MCHC: 33.2 g/dL (ref 30.0–36.0)
MCV: 88.2 fL (ref 78.0–100.0)
PLATELETS: 303 10*3/uL (ref 150–400)
RBC: 4.65 MIL/uL (ref 3.87–5.11)
RDW: 13 % (ref 11.5–15.5)
WBC: 16.9 10*3/uL — ABNORMAL HIGH (ref 4.0–10.5)

## 2014-05-13 LAB — CREATININE, SERUM
Creatinine, Ser: 0.69 mg/dL (ref 0.50–1.10)
GFR calc Af Amer: >90 mL/min (ref >90)
GFR calc non Af Amer: >90 mL/min (ref >90)

## 2014-05-13 SURGERY — ARTHROPLASTY, KNEE, TOTAL
Anesthesia: Regional | Site: Knee | Laterality: Left

## 2014-05-13 MED ORDER — BUPIVACAINE HCL (PF) 0.5 % IJ SOLN
INTRAMUSCULAR | Status: DC | PRN
  Administered 2014-05-13: 30 mL via PERINEURAL

## 2014-05-13 MED ORDER — LIDOCAINE HCL (CARDIAC) 20 MG/ML IV SOLN
INTRAVENOUS | Status: DC | PRN
  Administered 2014-05-13 (×2): 50 mg via INTRAVENOUS

## 2014-05-13 MED ORDER — CHLORHEXIDINE GLUCONATE 4 % EX LIQD
60.0000 mL | Freq: Once | CUTANEOUS | Status: DC
  Filled 2014-05-13: qty 60

## 2014-05-13 MED ORDER — METOCLOPRAMIDE HCL 5 MG/ML IJ SOLN
5.0000 mg | Freq: Three times a day (TID) | INTRAMUSCULAR | Status: DC | PRN
Start: 2014-05-13 — End: 2014-05-14

## 2014-05-13 MED ORDER — METOCLOPRAMIDE HCL 10 MG PO TABS: 5.0000 mg | ORAL_TABLET | Freq: Three times a day (TID) | ORAL | Status: DC | PRN

## 2014-05-13 MED ORDER — MIDAZOLAM HCL 2 MG/2ML IJ SOLN
INTRAMUSCULAR | Status: AC
  Filled 2014-05-13: qty 2

## 2014-05-13 MED ORDER — FENTANYL CITRATE 0.05 MG/ML IJ SOLN
INTRAMUSCULAR | Status: AC
  Filled 2014-05-13: qty 5

## 2014-05-13 MED ORDER — ONDANSETRON HCL 4 MG/2ML IJ SOLN: 4.0000 mg | Freq: Four times a day (QID) | INTRAMUSCULAR | Status: DC | PRN

## 2014-05-13 MED ORDER — MORPHINE SULFATE 2 MG/ML IJ SOLN
INTRAMUSCULAR | Status: AC
  Filled 2014-05-13: qty 1

## 2014-05-13 MED ORDER — MIDAZOLAM HCL 2 MG/2ML IJ SOLN
1.5000 mg | Freq: Once | INTRAMUSCULAR | Status: AC
  Administered 2014-05-13: 1.5 mg via INTRAVENOUS

## 2014-05-13 MED ORDER — OXYCODONE HCL 5 MG PO TABS
ORAL_TABLET | ORAL | Status: AC
  Filled 2014-05-13: qty 1

## 2014-05-13 MED ORDER — DEXAMETHASONE SODIUM PHOSPHATE 4 MG/ML IJ SOLN
INTRAMUSCULAR | Status: AC
  Filled 2014-05-13: qty 1

## 2014-05-13 MED ORDER — HYDROMORPHONE HCL 1 MG/ML IJ SOLN
0.2500 mg | INTRAMUSCULAR | Status: DC | PRN
  Administered 2014-05-13 (×4): 0.5 mg via INTRAVENOUS

## 2014-05-13 MED ORDER — ONDANSETRON HCL 4 MG/2ML IJ SOLN
INTRAMUSCULAR | Status: DC | PRN
  Administered 2014-05-13: 4 mg via INTRAVENOUS

## 2014-05-13 MED ORDER — SODIUM CHLORIDE 0.9 % IV SOLN: INTRAVENOUS | Status: DC

## 2014-05-13 MED ORDER — SENNOSIDES-DOCUSATE SODIUM 8.6-50 MG PO TABS
1.0000 | ORAL_TABLET | Freq: Every evening | ORAL | Status: DC | PRN
Start: 2014-05-13 — End: 2014-05-14

## 2014-05-13 MED ORDER — ACETAMINOPHEN 325 MG PO TABS: 650.0000 mg | ORAL_TABLET | Freq: Four times a day (QID) | ORAL | Status: DC | PRN

## 2014-05-13 MED ORDER — PROPOFOL 10 MG/ML IV BOLUS
INTRAVENOUS | Status: DC | PRN
  Administered 2014-05-13: 150 mg via INTRAVENOUS

## 2014-05-13 MED ORDER — BUPIVACAINE-EPINEPHRINE 0.5% -1:200000 IJ SOLN
INTRAMUSCULAR | Status: DC | PRN
  Administered 2014-05-13: 20 mL

## 2014-05-13 MED ORDER — METHOCARBAMOL 1000 MG/10ML IJ SOLN
500.0000 mg | Freq: Four times a day (QID) | INTRAVENOUS | Status: DC | PRN
  Administered 2014-05-13 – 2014-05-14 (×2): 500 mg via INTRAVENOUS
  Filled 2014-05-13 (×3): qty 5

## 2014-05-13 MED ORDER — CELECOXIB 200 MG PO CAPS
200.0000 mg | ORAL_CAPSULE | Freq: Two times a day (BID) | ORAL | Status: DC
  Administered 2014-05-13 – 2014-05-14 (×2): 200 mg via ORAL
  Filled 2014-05-13 (×2): qty 1

## 2014-05-13 MED ORDER — BUPIVACAINE LIPOSOME 1.3 % IJ SUSP
INTRAMUSCULAR | Status: DC | PRN
  Administered 2014-05-13: 20 mL

## 2014-05-13 MED ORDER — ARTIFICIAL TEARS OP OINT
TOPICAL_OINTMENT | OPHTHALMIC | Status: AC
  Filled 2014-05-13: qty 3.5

## 2014-05-13 MED ORDER — LACTATED RINGERS IV SOLN
INTRAVENOUS | Status: DC
  Administered 2014-05-13: 08:00:00 via INTRAVENOUS

## 2014-05-13 MED ORDER — BUPIVACAINE HCL (PF) 0.5 % IJ SOLN
INTRAMUSCULAR | Status: AC
  Filled 2014-05-13: qty 10

## 2014-05-13 MED ORDER — LACTATED RINGERS IV SOLN
INTRAVENOUS | Status: DC | PRN
  Administered 2014-05-13 (×2): via INTRAVENOUS

## 2014-05-13 MED ORDER — PHENYLEPHRINE HCL 10 MG/ML IJ SOLN
INTRAMUSCULAR | Status: DC | PRN
  Administered 2014-05-13 (×2): 40 ug via INTRAVENOUS

## 2014-05-13 MED ORDER — BISACODYL 5 MG PO TBEC: 5.0000 mg | DELAYED_RELEASE_TABLET | Freq: Every day | ORAL | Status: DC | PRN

## 2014-05-13 MED ORDER — ONDANSETRON HCL 4 MG/2ML IJ SOLN
INTRAMUSCULAR | Status: AC
  Filled 2014-05-13: qty 2

## 2014-05-13 MED ORDER — ALUM & MAG HYDROXIDE-SIMETH 200-200-20 MG/5ML PO SUSP: 30.0000 mL | ORAL | Status: DC | PRN

## 2014-05-13 MED ORDER — DOCUSATE SODIUM 100 MG PO CAPS
100.0000 mg | ORAL_CAPSULE | Freq: Two times a day (BID) | ORAL | Status: DC
  Administered 2014-05-13 – 2014-05-14 (×2): 100 mg via ORAL
  Filled 2014-05-13 (×2): qty 1

## 2014-05-13 MED ORDER — METHOCARBAMOL 500 MG PO TABS
500.0000 mg | ORAL_TABLET | Freq: Four times a day (QID) | ORAL | Status: DC | PRN
  Administered 2014-05-13 – 2014-05-14 (×2): 500 mg via ORAL
  Filled 2014-05-13 (×4): qty 1

## 2014-05-13 MED ORDER — PROMETHAZINE HCL 25 MG/ML IJ SOLN: 6.2500 mg | INTRAMUSCULAR | Status: DC | PRN

## 2014-05-13 MED ORDER — OXYCODONE HCL 5 MG PO TABS
5.0000 mg | ORAL_TABLET | Freq: Once | ORAL | Status: AC | PRN
  Administered 2014-05-13: 5 mg via ORAL

## 2014-05-13 MED ORDER — DIPHENHYDRAMINE HCL 12.5 MG/5ML PO ELIX
12.5000 mg | ORAL_SOLUTION | ORAL | Status: DC | PRN
Start: 2014-05-13 — End: 2014-05-14

## 2014-05-13 MED ORDER — OXYCODONE HCL 5 MG PO TABS
5.0000 mg | ORAL_TABLET | ORAL | Status: DC | PRN
  Administered 2014-05-13: 5 mg via ORAL
  Administered 2014-05-13 – 2014-05-14 (×5): 10 mg via ORAL
  Filled 2014-05-13 (×5): qty 2

## 2014-05-13 MED ORDER — FLEET ENEMA 7-19 GM/118ML RE ENEM: 1.0000 | ENEMA | Freq: Once | RECTAL | Status: AC | PRN

## 2014-05-13 MED ORDER — PHENOL 1.4 % MT LIQD: 1.0000 | OROMUCOSAL | Status: DC | PRN

## 2014-05-13 MED ORDER — FENTANYL CITRATE 0.05 MG/ML IJ SOLN
INTRAMUSCULAR | Status: AC
  Administered 2014-05-13: 75 ug via INTRAVENOUS
  Filled 2014-05-13: qty 2

## 2014-05-13 MED ORDER — MIDAZOLAM HCL 5 MG/5ML IJ SOLN
INTRAMUSCULAR | Status: DC | PRN
  Administered 2014-05-13 (×2): 1 mg via INTRAVENOUS

## 2014-05-13 MED ORDER — FENTANYL CITRATE 0.05 MG/ML IJ SOLN
75.0000 ug | Freq: Once | INTRAMUSCULAR | Status: AC
Start: 2014-05-13 — End: 2014-05-13
  Administered 2014-05-13: 75 ug via INTRAVENOUS

## 2014-05-13 MED ORDER — HYDROMORPHONE HCL 1 MG/ML IJ SOLN
1.0000 mg | INTRAMUSCULAR | Status: DC | PRN
  Administered 2014-05-13 – 2014-05-14 (×5): 1 mg via INTRAVENOUS
  Filled 2014-05-13 (×5): qty 1

## 2014-05-13 MED ORDER — ENOXAPARIN SODIUM 30 MG/0.3ML ~~LOC~~ SOLN
30.0000 mg | Freq: Two times a day (BID) | SUBCUTANEOUS | Status: DC
  Administered 2014-05-14: 30 mg via SUBCUTANEOUS
  Filled 2014-05-13: qty 0.3

## 2014-05-13 MED ORDER — SODIUM CHLORIDE 0.9 % IR SOLN
Status: DC | PRN
  Administered 2014-05-13: 1000 mL

## 2014-05-13 MED ORDER — PROPOFOL 10 MG/ML IV BOLUS
INTRAVENOUS | Status: AC
  Filled 2014-05-13: qty 20

## 2014-05-13 MED ORDER — OXYCODONE HCL 5 MG/5ML PO SOLN: 5.0000 mg | Freq: Once | ORAL | Status: AC | PRN

## 2014-05-13 MED ORDER — FENTANYL CITRATE 0.05 MG/ML IJ SOLN
INTRAMUSCULAR | Status: DC | PRN
  Administered 2014-05-13: 50 ug via INTRAVENOUS
  Administered 2014-05-13 (×2): 25 ug via INTRAVENOUS
  Administered 2014-05-13 (×2): 50 ug via INTRAVENOUS
  Administered 2014-05-13 (×2): 25 ug via INTRAVENOUS

## 2014-05-13 MED ORDER — MIDAZOLAM HCL 2 MG/2ML IJ SOLN
INTRAMUSCULAR | Status: AC
  Administered 2014-05-13: 1.5 mg via INTRAVENOUS
  Filled 2014-05-13: qty 2

## 2014-05-13 MED ORDER — CEFAZOLIN SODIUM-DEXTROSE 2-3 GM-% IV SOLR
2.0000 g | Freq: Four times a day (QID) | INTRAVENOUS | Status: AC
  Administered 2014-05-13 (×2): 2 g via INTRAVENOUS
  Filled 2014-05-13 (×2): qty 50

## 2014-05-13 MED ORDER — CEFAZOLIN SODIUM-DEXTROSE 2-3 GM-% IV SOLR
2.0000 g | Freq: Once | INTRAVENOUS | Status: AC
  Administered 2014-05-13: 2 g via INTRAVENOUS
  Filled 2014-05-13: qty 50

## 2014-05-13 MED ORDER — 0.9 % SODIUM CHLORIDE (POUR BTL) OPTIME
TOPICAL | Status: DC | PRN
  Administered 2014-05-13: 1000 mL

## 2014-05-13 MED ORDER — ONDANSETRON HCL 4 MG PO TABS: 4.0000 mg | ORAL_TABLET | Freq: Four times a day (QID) | ORAL | Status: DC | PRN

## 2014-05-13 MED ORDER — MENTHOL 3 MG MT LOZG: 1.0000 | LOZENGE | OROMUCOSAL | Status: DC | PRN

## 2014-05-13 MED ORDER — HYDROMORPHONE HCL 1 MG/ML IJ SOLN
INTRAMUSCULAR | Status: AC
  Filled 2014-05-13: qty 2

## 2014-05-13 MED ORDER — OXYCODONE HCL ER 10 MG PO T12A
10.0000 mg | EXTENDED_RELEASE_TABLET | Freq: Two times a day (BID) | ORAL | Status: DC
  Administered 2014-05-13 – 2014-05-14 (×2): 10 mg via ORAL
  Filled 2014-05-13 (×2): qty 1

## 2014-05-13 MED ORDER — MORPHINE SULFATE 2 MG/ML IJ SOLN
1.0000 mg | INTRAMUSCULAR | Status: DC | PRN
  Administered 2014-05-13 (×4): 2 mg via INTRAVENOUS

## 2014-05-13 MED ORDER — ACETAMINOPHEN 650 MG RE SUPP: 650.0000 mg | Freq: Four times a day (QID) | RECTAL | Status: DC | PRN

## 2014-05-13 MED ORDER — LIDOCAINE HCL (CARDIAC) 20 MG/ML IV SOLN
INTRAVENOUS | Status: AC
  Filled 2014-05-13: qty 5

## 2014-05-13 MED ORDER — DEXAMETHASONE SODIUM PHOSPHATE 4 MG/ML IJ SOLN
INTRAMUSCULAR | Status: DC | PRN
  Administered 2014-05-13: 4 mg via INTRAVENOUS

## 2014-05-13 MED ORDER — ZOLPIDEM TARTRATE 5 MG PO TABS: 5.0000 mg | ORAL_TABLET | Freq: Every evening | ORAL | Status: DC | PRN

## 2014-05-13 SURGICAL SUPPLY — 61 items
BANDAGE ESMARK 6X9 LF (GAUZE/BANDAGES/DRESSINGS) ×1 IMPLANT
BLADE SAGITTAL 13X1.27X60 (BLADE) ×2 IMPLANT
BLADE SAGITTAL 13X1.27X60MM (BLADE) ×1
BLADE SAW SGTL 83.5X18.5 (BLADE) ×3 IMPLANT
BLADE SURG 10 STRL SS (BLADE) ×3 IMPLANT
BNDG CMPR 9X6 STRL LF SNTH (GAUZE/BANDAGES/DRESSINGS) ×1
BNDG ESMARK 6X9 LF (GAUZE/BANDAGES/DRESSINGS) ×3
BOWL SMART MIX CTS (DISPOSABLE) ×3 IMPLANT
CAPT KNEE TOTAL 3 ×3 IMPLANT
CEMENT BONE SIMPLEX SPEEDSET (Cement) ×6 IMPLANT
COVER SURGICAL LIGHT HANDLE (MISCELLANEOUS) ×3 IMPLANT
CUFF TOURNIQUET SINGLE 34IN LL (TOURNIQUET CUFF) ×3 IMPLANT
DRAPE EXTREMITY T 121X128X90 (DRAPE) ×3 IMPLANT
DRAPE IMP U-DRAPE 54X76 (DRAPES) ×3 IMPLANT
DRAPE INCISE IOBAN 66X45 STRL (DRAPES) ×6 IMPLANT
DRAPE PROXIMA HALF (DRAPES) IMPLANT
DRAPE U-SHAPE 47X51 STRL (DRAPES) ×3 IMPLANT
DRSG ADAPTIC 3X8 NADH LF (GAUZE/BANDAGES/DRESSINGS) ×3 IMPLANT
DRSG PAD ABDOMINAL 8X10 ST (GAUZE/BANDAGES/DRESSINGS) ×3 IMPLANT
DURAPREP 26ML APPLICATOR (WOUND CARE) ×6 IMPLANT
ELECT REM PT RETURN 9FT ADLT (ELECTROSURGICAL) ×3
ELECTRODE REM PT RTRN 9FT ADLT (ELECTROSURGICAL) ×1 IMPLANT
GAUZE SPONGE 4X4 12PLY STRL (GAUZE/BANDAGES/DRESSINGS) ×3 IMPLANT
GLOVE BIOGEL M 7.0 STRL (GLOVE) IMPLANT
GLOVE BIOGEL PI IND STRL 6.5 (GLOVE) IMPLANT
GLOVE BIOGEL PI IND STRL 7.5 (GLOVE) IMPLANT
GLOVE BIOGEL PI IND STRL 8.5 (GLOVE) ×2 IMPLANT
GLOVE BIOGEL PI INDICATOR 6.5 (GLOVE) ×2
GLOVE BIOGEL PI INDICATOR 7.5 (GLOVE)
GLOVE BIOGEL PI INDICATOR 8.5 (GLOVE) ×4
GLOVE SS N UNI LF 7.0 STRL (GLOVE) ×2 IMPLANT
GLOVE SURG ORTHO 8.0 STRL STRW (GLOVE) ×10 IMPLANT
GOWN STRL REUS W/ TWL LRG LVL3 (GOWN DISPOSABLE) ×1 IMPLANT
GOWN STRL REUS W/ TWL XL LVL3 (GOWN DISPOSABLE) ×2 IMPLANT
GOWN STRL REUS W/TWL LRG LVL3 (GOWN DISPOSABLE) ×3
GOWN STRL REUS W/TWL XL LVL3 (GOWN DISPOSABLE) ×6
HANDPIECE INTERPULSE COAX TIP (DISPOSABLE) ×3
HOOD PEEL AWAY FACE SHEILD DIS (HOOD) ×9 IMPLANT
KIT BASIN OR (CUSTOM PROCEDURE TRAY) ×3 IMPLANT
KIT ROOM TURNOVER OR (KITS) ×3 IMPLANT
KNEE CAPITATED TOTAL 3 IMPLANT
MANIFOLD NEPTUNE II (INSTRUMENTS) ×3 IMPLANT
NEEDLE 22X1 1/2 (OR ONLY) (NEEDLE) ×6 IMPLANT
NS IRRIG 1000ML POUR BTL (IV SOLUTION) ×3 IMPLANT
PACK TOTAL JOINT (CUSTOM PROCEDURE TRAY) ×3 IMPLANT
PACK UNIVERSAL I (CUSTOM PROCEDURE TRAY) ×3 IMPLANT
PAD ARMBOARD 7.5X6 YLW CONV (MISCELLANEOUS) ×6 IMPLANT
PADDING CAST COTTON 6X4 STRL (CAST SUPPLIES) ×3 IMPLANT
SET HNDPC FAN SPRY TIP SCT (DISPOSABLE) ×1 IMPLANT
STAPLER VISISTAT 35W (STAPLE) ×3 IMPLANT
SUCTION FRAZIER TIP 10 FR DISP (SUCTIONS) ×3 IMPLANT
SUT BONE WAX W31G (SUTURE) ×5 IMPLANT
SUT VIC AB 0 CTB1 27 (SUTURE) ×6 IMPLANT
SUT VIC AB 1 CT1 27 (SUTURE) ×6
SUT VIC AB 1 CT1 27XBRD ANBCTR (SUTURE) ×2 IMPLANT
SUT VIC AB 2-0 CT1 27 (SUTURE) ×6
SUT VIC AB 2-0 CT1 TAPERPNT 27 (SUTURE) ×2 IMPLANT
SYR 20CC LL (SYRINGE) ×6 IMPLANT
TOWEL OR 17X24 6PK STRL BLUE (TOWEL DISPOSABLE) ×5 IMPLANT
TOWEL OR 17X26 10 PK STRL BLUE (TOWEL DISPOSABLE) ×3 IMPLANT
WATER STERILE IRR 1000ML POUR (IV SOLUTION) ×6 IMPLANT

## 2014-05-13 NOTE — Progress Notes (Signed)
Utilization review completed.  

## 2014-05-13 NOTE — Progress Notes (Signed)
CPM started, patient did not tolerate 90-0, was started at 60-0

## 2014-05-13 NOTE — Anesthesia Preprocedure Evaluation (Signed)
Anesthesia Evaluation  Patient identified by MRN, date of birth, ID band Patient awake    Reviewed: Allergy & Precautions, Patient's Chart, lab work & pertinent test results  History of Anesthesia Complications Negative for: history of anesthetic complications  Airway Mallampati: II       Dental   Pulmonary asthma , former smoker,          Cardiovascular negative cardio ROS  Rhythm:Regular Rate:Normal     Neuro/Psych    GI/Hepatic negative GI ROS, Neg liver ROS,   Endo/Other  negative endocrine ROS  Renal/GU negative Renal ROS     Musculoskeletal   Abdominal   Peds  Hematology negative hematology ROS (+)   Anesthesia Other Findings   Reproductive/Obstetrics                             Anesthesia Physical Anesthesia Plan  ASA: II  Anesthesia Plan: General and Regional   Post-op Pain Management:    Induction: Intravenous  Airway Management Planned: Natural Airway  Additional Equipment:   Intra-op Plan:   Post-operative Plan:   Informed Consent: I have reviewed the patients History and Physical, chart, labs and discussed the procedure including the risks, benefits and alternatives for the proposed anesthesia with the patient or authorized representative who has indicated his/her understanding and acceptance.     Plan Discussed with: CRNA and Surgeon  Anesthesia Plan Comments:         Anesthesia Quick Evaluation

## 2014-05-13 NOTE — Anesthesia Procedure Notes (Signed)
Anesthesia Regional Block:  Adductor canal block  Pre-Anesthetic Checklist: ,, timeout performed, Correct Patient, Correct Site, Correct Laterality, Correct Procedure, Correct Position, site marked, Risks and benefits discussed,  Surgical consent,  Pre-op evaluation,  At surgeon's request and post-op pain management  Laterality: Left and Upper  Prep: chloraprep       Needles:   Needle Type: Echogenic Needle     Needle Length: 9cm 9 cm Needle Gauge: 21 and 21 G  Needle insertion depth: 7 cm   Additional Needles:  Procedures: ultrasound guided (picture in chart) Adductor canal block Narrative:  Start time: 05/13/2014 8:25 AM End time: 05/13/2014 8:40 AM  Performed by: Personally  Anesthesiologist: Kieanna Rollo  Additional Notes: Tolerated well

## 2014-05-13 NOTE — H&P (Signed)
Cheryl Wang MRN:  [id number] DOB/SEX:  1950-08-29/female  CHIEF COMPLAINT:  Painful left Knee  HISTORY: Patient is a 64 y.o. female presented with a history of pain in the left knee. Onset of symptoms was gradual starting several years ago with gradually worsening course since that time. Prior procedures on the knee include none. Patient has been treated conservatively with over-the-counter NSAIDs and activity modification. Patient currently rates pain in the knee at 10 out of 10 with activity. There is pain at night.  PAST MEDICAL HISTORY: Patient Active Problem List   Diagnosis Date Noted  . Hx of migraines 11/24/2012  . Left knee DJD 11/24/2012  . Right knee DJD 11/24/2012  . Obesity, unspecified 11/24/2012  . Neck pain 11/24/2012   Past Medical History  Diagnosis Date  . Chicken pox   . Hives   . Medical history non-contributory   . Arthritis     in hands  . Wears glasses   . Asthma     as a child  . Eczema    Past Surgical History  Procedure Laterality Date  . Knee surgery      Bil  . Cyst removal  10/1988    on chest   . Abdominal hysterectomy  2002    VAG HYST-  . Knee arthroscopy  1995    RIGHT AND LEFT  . Colonoscopy    . Tonsillectomy    . Carpal tunnel release Left 08/30/2012    Procedure: CARPAL TUNNEL RELEASE;  Surgeon: Nicki ReaperGary R Kuzma, MD;  Location: [facility];  Service: Orthopedics;  Laterality: Left;  . Trigger finger release Left 08/30/2012    Procedure: STS RELEASE LEFT THUMB;  Surgeon: Nicki ReaperGary R Kuzma, MD;  Location: [facility];  Service: Orthopedics;  Laterality: Left;     MEDICATIONS:   Prescriptions prior to admission  Medication Sig Dispense Refill Last Dose  . Bioflavonoid Products (ESTER C PO) Take 2 tablets by mouth daily.     . Biotin 10 MG TABS Take 10 mg by mouth daily.     Marland Kitchen. GLUCOSAMINE HCL PO Take 1 tablet by mouth 2 (two) times daily.     Marland Kitchen. ibuprofen (ADVIL,MOTRIN) 200 MG tablet Take 200 mg by mouth 2 (two)  times daily.     . Multiple Vitamin (MULTIVITAMIN WITH MINERALS) TABS tablet Take 1 tablet by mouth daily.     Marland Kitchen. zoster vaccine live, PF, (ZOSTAVAX) [id number] UNT/0.65ML injection Inject 19,400 Units into the skin once. 1 each 0     ALLERGIES:   Allergies  Allergen Reactions  . Penicillins      Does not work effectively.    REVIEW OF SYSTEMS:  A comprehensive review of systems was negative.   FAMILY HISTORY:   Family History  Problem Relation Age of Onset  . Anorectal malformation Son   . Heart disease Brother   . Hyperlipidemia Father     SOCIAL HISTORY:   History  Substance Use Topics  . Smoking status: Former Smoker    Types: Cigarettes    Quit date: 08/26/1978  . Smokeless tobacco: Never Used  . Alcohol Use: No     Comment: OCC     EXAMINATION:  Vital signs in last 24 hours:    General appearance: alert, cooperative and no distress Lungs: clear to auscultation bilaterally Heart: regular rate and rhythm, S1, S2 normal, no murmur, click, rub or gallop Abdomen: soft, non-tender; bowel sounds normal; no masses,  no organomegaly Extremities: extremities  normal, atraumatic, no cyanosis or edema and Homans sign is negative, no sign of DVT Pulses: 2+ and symmetric Skin: Skin color, texture, turgor normal. No rashes or lesions Neurologic: Alert and oriented X 3, normal strength and tone. Normal symmetric reflexes. Normal coordination and gait  Musculoskeletal:  ROM 0-90, Ligaments intact,  Imaging Review Plain radiographs demonstrate severe degenerative joint disease of the left knee. The overall alignment is significant varus. The bone quality appears to be good for age and reported activity level.  Assessment/Plan: Primary osteoarthritis, left knee   The patient history, physical examination and imaging studies are consistent with advanced degenerative joint disease of the left knee. The patient has failed conservative treatment.  The clearance notes were reviewed.   After discussion with the patient it was felt that Total Knee Replacement was indicated. The procedure,  risks, and benefits of total knee arthroplasty were presented and reviewed. The risks including but not limited to aseptic loosening, infection, blood clots, vascular injury, stiffness, patella tracking problems complications among others were discussed. The patient acknowledged the explanation, agreed to proceed with the plan.  Cheryl Wang 05/13/2014, 6:57 AM

## 2014-05-13 NOTE — Anesthesia Postprocedure Evaluation (Signed)
  Anesthesia Post-op Note  Patient: Cheryl Wang  Procedure(s) Performed: Procedure(s): LEFT TOTAL KNEE ARTHROPLASTY (Left)  Patient Location: PACU  Anesthesia Type:General and GA combined with regional for post-op pain  Level of Consciousness: awake and alert   Airway and Oxygen Therapy: Patient Spontanous Breathing  Post-op Pain: mild  Post-op Assessment: Post-op Vital signs reviewed  Post-op Vital Signs: stable  Last Vitals:  Filed Vitals:   05/13/14 1256  BP:   Pulse: 67  Temp:   Resp: 10    Complications: No apparent anesthesia complications

## 2014-05-13 NOTE — Progress Notes (Signed)
Orthopedic Tech Progress Note Patient Details:  Carylon PerchesLois L Maclachlan [birth year] [id number]  CPM Left Knee CPM Left Knee: On Left Knee Flexion (Degrees): 60 Left Knee Extension (Degrees): 0 Additional Comments: trapeze bar patient helper Viewed order from doctor's order list Placed pt's lle on cpm @0 -60 degrees; will increase as pt tolerates; RN notified Nikki DomCrawford, Myan Suit 05/13/2014, 11:25 AM

## 2014-05-13 NOTE — Transfer of Care (Signed)
Immediate Anesthesia Transfer of Care Note  Patient: Cheryl Wang  Procedure(s) Performed: Procedure(s): LEFT TOTAL KNEE ARTHROPLASTY (Left)  Patient Location: PACU  Anesthesia Type:General  Level of Consciousness: awake, patient cooperative and responds to stimulation  Airway & Oxygen Therapy: Patient Spontanous Breathing and Patient connected to nasal cannula oxygen  Post-op Assessment: Report given to RN and Post -op Vital signs reviewed and stable  Post vital signs: Reviewed and stable  Last Vitals:  Filed Vitals:   05/13/14 0843  BP:   Pulse: 76  Temp:   Resp: 15    Complications: No apparent anesthesia complications

## 2014-05-13 NOTE — Evaluation (Signed)
Physical Therapy Evaluation Patient Details Name: Cheryl Wang MRN: [id number] DOB: 1950-08-24 Today's Date: 05/13/2014   History of Present Illness  Patient is a 64 yo female admitted 05/13/14 now s/p Lt TKA.  PMH:  migraines, obesity, neck pain, DJD, Bil knee surgery  Clinical Impression  Patient presents with problems listed below.  Will benefit from acute PT to maximize independence prior to discharge home with family.      Follow Up Recommendations Home health PT;Supervision/Assistance - 24 hour    Equipment Recommendations  None recommended by PT    Recommendations for Other Services       Precautions / Restrictions Precautions Precautions: Knee Precaution Booklet Issued: Yes (comment) Precaution Comments: Reviewed precautions with patient and husband Restrictions Weight Bearing Restrictions: Yes LLE Weight Bearing: Weight bearing as tolerated      Mobility  Bed Mobility Overal bed mobility: Needs Assistance Bed Mobility: Supine to Sit     Supine to sit: Min assist     General bed mobility comments: Verbal cues for technique.  Assist to bring LLE off of bed.  Patient able to use UE's to move trunk to sitting position.  Good balance in sitting.  Transfers Overall transfer level: Needs assistance Equipment used: Rolling walker (2 wheeled) Transfers: Sit to/from UGI CorporationStand;Stand Pivot Transfers Sit to Stand: Min assist Stand pivot transfers: Min assist       General transfer comment: Verbal cues for hand placement and technique. Assist to rise to standing on first attempt.  To min guard on 3rd attempt.  Patient able to take several steps to pivot to chair.  Reports she needs to void.  Patient then transfered chair <> BSC with min assist.  Ambulation/Gait                Stairs            Wheelchair Mobility    Modified Rankin (Stroke Patients Only)       Balance                                             Pertinent  Vitals/Pain Pain Assessment: 0-10 Pain Score: 6  Pain Location: Lt knee Pain Descriptors / Indicators: Aching Pain Intervention(s): Limited activity within patient's tolerance;Premedicated before session;Repositioned    Home Living Family/patient expects to be discharged to:: Private residence Living Arrangements: Spouse/significant other Available Help at Discharge: Family;Available 24 hours/day (husband and daughter) Type of Home: House Home Access: Stairs to enter Entrance Stairs-Rails: None Entrance Stairs-Number of Steps: 2 Home Layout: One level Home Equipment: Walker - 2 wheels;Bedside commode      Prior Function Level of Independence: Independent               Hand Dominance        Extremity/Trunk Assessment   Upper Extremity Assessment: Overall WFL for tasks assessed           Lower Extremity Assessment: LLE deficits/detail   LLE Deficits / Details: Decreased strength and ROM post-op  Cervical / Trunk Assessment: Normal  Communication   Communication: No difficulties  Cognition Arousal/Alertness: Awake/alert Behavior During Therapy: WFL for tasks assessed/performed Overall Cognitive Status: Within Functional Limits for tasks assessed                      General Comments      Exercises Total Joint  Exercises Ankle Circles/Pumps: AROM;Both;10 reps;Seated Quad Sets: AROM;Left;5 reps;Seated      Assessment/Plan    PT Assessment Patient needs continued PT services  PT Diagnosis Difficulty walking;Acute pain   PT Problem List Decreased strength;Decreased range of motion;Decreased activity tolerance;Decreased balance;Decreased mobility;Decreased knowledge of use of DME;Decreased knowledge of precautions;Pain  PT Treatment Interventions DME instruction;Gait training;Stair training;Functional mobility training;Therapeutic activities;Therapeutic exercise;Patient/family education   PT Goals (Current goals can be found in the Care Plan  section) Acute Rehab PT Goals Patient Stated Goal: To go home soon PT Goal Formulation: With patient/family Time For Goal Achievement: 05/20/14 Potential to Achieve Goals: Good    Frequency 7X/week   Barriers to discharge        Co-evaluation               End of Session Equipment Utilized During Treatment: Gait belt;Oxygen Activity Tolerance: Patient tolerated treatment well;Patient limited by pain Patient left: in chair;with call bell/phone within reach;with family/visitor present Nurse Communication: Mobility status         Time: 1478-2956 PT Time Calculation (min) (ACUTE ONLY): 37 min   Charges:   PT Evaluation $Initial PT Evaluation Tier I: 1 Procedure PT Treatments $Therapeutic Activity: 8-22 mins   PT G Codes:        Vena Austria 05-14-14, 7:13 PM Durenda Hurt. Renaldo Fiddler, [facility] Pager [phone]

## 2014-05-14 ENCOUNTER — Encounter (HOSPITAL_COMMUNITY): Payer: Self-pay | Admitting: Orthopedic Surgery

## 2014-05-14 LAB — CBC
HCT: 37.5 % (ref 36.0–46.0)
Hemoglobin: 12.1 g/dL (ref 12.0–15.0)
MCH: 29 pg (ref 26.0–34.0)
MCHC: 32.3 g/dL (ref 30.0–36.0)
MCV: 89.9 fL (ref 78.0–100.0)
Platelets: 280 10*3/uL (ref 150–400)
RBC: 4.17 MIL/uL (ref 3.87–5.11)
RDW: 13.5 % (ref 11.5–15.5)
WBC: 12 10*3/uL — ABNORMAL HIGH (ref 4.0–10.5)

## 2014-05-14 LAB — BASIC METABOLIC PANEL
Anion gap: 9 (ref 5–15)
BUN: 14 mg/dL (ref 6–23)
CALCIUM: 8.5 mg/dL (ref 8.4–10.5)
CO2: 29 mmol/L (ref 19–32)
Chloride: 102 mmol/L (ref 96–112)
Creatinine, Ser: 0.92 mg/dL (ref 0.50–1.10)
GFR calc Af Amer: 76 mL/min — ABNORMAL LOW (ref >90)
GFR calc non Af Amer: 66 mL/min — ABNORMAL LOW (ref >90)
GLUCOSE: 138 mg/dL — AB (ref 70–99)
Potassium: 4.8 mmol/L (ref 3.5–5.1)
SODIUM: 140 mmol/L (ref 135–145)

## 2014-05-14 MED ORDER — OXYCODONE HCL 5 MG PO TABS: 5.0000 mg | ORAL_TABLET | ORAL | Status: DC | PRN

## 2014-05-14 MED ORDER — ENOXAPARIN SODIUM 40 MG/0.4ML ~~LOC~~ SOLN: 40.0000 mg | SUBCUTANEOUS | Status: DC

## 2014-05-14 MED ORDER — METHOCARBAMOL 500 MG PO TABS: 500.0000 mg | ORAL_TABLET | Freq: Four times a day (QID) | ORAL | Status: DC | PRN

## 2014-05-14 MED ORDER — OXYCODONE HCL ER 10 MG PO T12A: 10.0000 mg | EXTENDED_RELEASE_TABLET | Freq: Two times a day (BID) | ORAL | Status: DC

## 2014-05-14 NOTE — Progress Notes (Signed)
CARE MANAGEMENT NOTE 05/14/2014  Patient:  Cheryl Wang,Cheryl Wang   Account Number:  [id number]  Date Initiated:  05/14/2014  Documentation initiated by:  [facility] HospitalKRIEG,Cheryl Breeden  Subjective/Objective Assessment:   s/p left TKA     Action/Plan:   PT/OT evals- recommended HHPT   Anticipated DC Date:  05/14/2014   Anticipated DC Plan:  HOME W HOME HEALTH SERVICES      DC Planning Services  CM consult      [facility]  DURABLE MEDICAL EQUIPMENT  HOME HEALTH   Choice offered to / List presented to:  C-1 Patient   DME arranged  3-N-1  CPM  WALKER - ROLLING      DME agency  [employer]     HH arranged  HH-2 PT      [facility]   Status of service:  Completed, signed off  Discharge Disposition:  HOME W HOME HEALTH SERVICES  Per UR Regulation:  Reviewed for med. necessity/level of care/duration of stay   Comments:  05/14/14 [facility] by MD office. Spoke with patient, no change in d/c plan, husband will be able to assist after d/c. Spoke with Cheryl Wang from [employer] they are provided CPM, 3N1 and rolling walker. NO other d/c needs identified.

## 2014-05-14 NOTE — Progress Notes (Signed)
Physical Therapy Treatment Patient Details Name: Cheryl Wang MRN: [id number] DOB: [birth year] Today's Date: 05/14/2014    History of Present Illness Patient is a 64 yo female admitted 05/13/14 now s/p Lt TKA.  PMH:  migraines, obesity, neck pain, DJD, Bil knee surgery    PT Comments    Pt is hesitant to place weight on LLE and requires encouragement and reassurance to do so.  Pt was able to ambulate and ascend/descend stair today.  Pt reports she only has one step to get inside her home so this was done today instead of 2 stairs (as pt previously indicated).  Pt is anticipating d/c to home today.  Follow Up Recommendations  Home health PT;Supervision/Assistance - 24 hour     Equipment Recommendations  None recommended by PT    Recommendations for Other Services       Precautions / Restrictions Precautions Precautions: Fall;Knee Precaution Comments: Reviewed precautions with patient Restrictions Weight Bearing Restrictions: Yes LLE Weight Bearing: Weight bearing as tolerated    Mobility  Bed Mobility Overal bed mobility: Needs Assistance Bed Mobility: Supine to Sit;Sit to Supine     Supine to sit: Supervision Sit to supine: Supervision   General bed mobility comments: Pt requires increased time.  Was able to scoot toward [facility] w/o using trapeze which she was using previously.  Transfers Overall transfer level: Needs assistance Equipment used: Rolling walker (2 wheeled) Transfers: Sit to/from Stand Sit to Stand: Min guard         General transfer comment: verbal cues to stand upright and put weight as tolerated through LLE  Ambulation/Gait Ambulation/Gait assistance: Supervision Ambulation Distance (Feet): 100 Feet Assistive device: Rolling walker (2 wheeled) Gait Pattern/deviations: Step-to pattern;Decreased stride length;Decreased stance time - left;Antalgic;Trunk flexed Gait velocity: decreased Gait velocity interpretation: Below normal speed for  age/gender General Gait Details: verbal cues to stand upright and to put as much weight as tolerable through LLE   Stairs Stairs: Yes Stairs assistance: Supervision Stair Management: No rails;Forwards;With walker Number of Stairs: 1 General stair comments: Pt reported that she feels comfortable teaching her husband how to assist her during stair ascent/descent by moving RW into place (husband was not present during session)  Wheelchair Mobility    Modified Rankin (Stroke Patients Only)       Balance Overall balance assessment: Needs assistance Sitting-balance support: Feet supported;No upper extremity supported Sitting balance-Leahy Scale: Good     Standing balance support: No upper extremity supported Standing balance-Leahy Scale: Fair                      Cognition Arousal/Alertness: Awake/alert Behavior During Therapy: WFL for tasks assessed/performed Overall Cognitive Status: Within Functional Limits for tasks assessed                      Exercises Total Joint Exercises Quad Sets: AROM;Both;10 reps;Supine Short Arc Quad: Left;AAROM;10 reps;Supine Heel Slides: AAROM;Left;10 reps;Supine Knee Flexion: AROM;AAROM;Left;Seated;5 reps Goniometric ROM: 11-76    General Comments General comments (skin integrity, edema, etc.): Pt was hesitant to place weight on LLE and required encouragement and reassurance to do so during functional activities      Pertinent Vitals/Pain Pain Assessment: 0-10 Pain Score: 8  Pain Location: L knee Pain Descriptors / Indicators: Aching;Nagging;Guarding;Grimacing;Discomfort Pain Intervention(s): Limited activity within patient's tolerance;Monitored during session;Repositioned    Home Living Family/patient expects to be discharged to:: Private residence Living Arrangements: Spouse/significant other Available Help at Discharge: Family;Available 24 hours/day Type of  Home: House Home Access: Stairs to enter Entrance  Stairs-Rails: None Home Layout: One level Home Equipment: Environmental consultantWalker - 2 wheels;Bedside commode;Shower seat      Prior Function Level of Independence: Independent          PT Goals (current goals can now be found in the care plan section) Acute Rehab PT Goals Patient Stated Goal: To go home soon PT Goal Formulation: With patient Time For Goal Achievement: 05/20/14 Potential to Achieve Goals: Good Progress towards PT goals: Progressing toward goals    Frequency  7X/week    PT Plan Current plan remains appropriate    Co-evaluation             End of Session Equipment Utilized During Treatment: Gait belt Activity Tolerance: Patient limited by pain Patient left: in bed;in CPM;with call bell/phone within reach     Time: [id number] PT Time Calculation (min) (ACUTE ONLY): 44 min  Charges:  $Gait Training: 23-37 mins $Therapeutic Exercise: 8-22 mins                    G CodesMichail Jewels:      Makaela Cando Parr PT, [place]PT [phone] 05/14/2014, 12:58 PM

## 2014-05-14 NOTE — Op Note (Signed)
TOTAL KNEE REPLACEMENT OPERATIVE NOTE:  05/13/2014  8:45 AM  PATIENT:  Cheryl Wang  64 y.o. female  PRE-OPERATIVE DIAGNOSIS:  primary osteoarthritis left knee  POST-OPERATIVE DIAGNOSIS:  primary osteoarthritis left knee  PROCEDURE:  Procedure(s): LEFT TOTAL KNEE ARTHROPLASTY  SURGEON:  Surgeon(s): Dannielle HuhSteve Zhoey Blackstock, MD  PHYSICIAN ASSISTANT: Altamese CabalMaurice Jones, [facility]  ANESTHESIA:   general  DRAINS: Hemovac  SPECIMEN: None  COUNTS:  Correct  TOURNIQUET:   Total Tourniquet Time Documented: Thigh (Left) - 62 minutes Total: Thigh (Left) - 62 minutes   DICTATION:  Indication for procedure:    The patient is a 64 y.o. female who has failed conservative treatment for primary osteoarthritis left knee.  Informed consent was obtained prior to anesthesia. The risks versus benefits of the operation were explain and in a way the patient can, and did, understand.   On the implant demand matching protocol, this patient scored 10.  Therefore, this patient did" "did not receive a polyethylene insert with vitamin E which is a high demand implant.  Description of procedure:     The patient was taken to the operating room and placed under anesthesia.  The patient was positioned in the usual fashion taking care that all body parts were adequately padded and/or protected.  I foley catheter was not placed.  A tourniquet was applied and the leg prepped and draped in the usual sterile fashion.  The extremity was exsanguinated with the esmarch and tourniquet inflated to 350 mmHg.  Pre-operative range of motion was 5-105  degrees flexion.  The knee was in 5 degree of mild varus.  A midline incision approximately 6-7 inches long was made with a #10 blade.  A new blade was used to make a parapatellar arthrotomy going 2-3 cm into the quadriceps tendon, over the patella, and alongside the medial aspect of the patellar tendon.  A synovectomy was then performed with the #10 blade and forceps. I then elevated the  deep MCL off the medial tibial metaphysis subperiosteally around to the semimembranosus attachment.    I everted the patella and used calipers to measure patellar thickness.  I used the reamer to ream down to appropriate thickness to recreate the native thickness.  I then removed excess bone with the rongeur and sagittal saw.  I used the appropriately sized template and drilled the three lug holes.  I then put the trial in place and measured the thickness with the calipers to ensure recreation of the native thickness.  The trial was then removed and the patella subluxed and the knee brought into flexion.  A homan retractor was place to retract and protect the patella and lateral structures.  A Z-retractor was place medially to protect the medial structures.  The extra-medullary alignment system was used to make cut the tibial articular surface perpendicular to the anamotic axis of the tibia and in 3 degrees of posterior slope.  The cut surface and alignment jig was removed.  I then used the intramedullary alignment guide to make a 6 valgus cut on the distal femur.  I then marked out the epicondylar axis on the distal femur.  The posterior condylar axis measured 3 degrees.  I then used the anterior referencing sizer and measured the femur to be a size 8.  The 4-In-1 cutting block was screwed into place in external rotation matching the posterior condylar angle, making our cuts perpendicular to the epicondylar axis.  Anterior, posterior and chamfer cuts were made with the sagittal saw.  The cutting  block and cut pieces were removed.  A lamina spreader was placed in 90 degrees of flexion.  The ACL, PCL, menisci, and posterior condylar osteophytes were removed.  A 10 mm spacer blocked was found to offer good flexion and extension gap balance after minimal in degree releasing.   The scoop retractor was then placed and the femoral finishing block was pinned in place.  The small sagittal saw was used as well as the  lug drill to finish the femur.  The block and cut surfaces were removed and the medullary canal hole filled with autograft bone from the cut pieces.  The tibia was delivered forward in deep flexion and external rotation.  A size E tray was selected and pinned into place centered on the medial 1/3 of the tibial tubercle.  The reamer and keel was used to prepare the tibia through the tray.    I then trialed with the size 8 femur, size E tibia, a 11 mm insert and the 35 patella.  I had excellent flexion/extension gap balance, excellent patella tracking.  Flexion was full and beyond 120 degrees; extension was zero.  These components were chosen and the staff opened them to me on the back table while the knee was lavaged copiously and the cement mixed.  The soft tissue was infiltrated with 60cc of exparel 1.3% through a 21 gauge needle.  I cemented in the components and removed all excess cement.  The polyethylene tibial component was snapped into place and the knee placed in extension while cement was hardening.  The capsule was infilltrated with 30cc of .25% Marcaine with epinephrine.  A hemovac was place in the joint exiting superolaterally.  A pain pump was place superomedially superficial to the arthrotomy.  Once the cement was hard, the tourniquet was let down.  Hemostasis was obtained.  The arthrotomy was closed with figure-8 #1 vicryl sutures.  The deep soft tissues were closed with #0 vicryls and the subcuticular layer closed with a running #2-0 vicryl.  The skin was reapproximated and closed with skin staples.  The wound was dressed with xeroform, 4 x4's, 2 ABD sponges, a single layer of webril and a TED stocking.   The patient was then awakened, extubated, and taken to the recovery room in stable condition.  BLOOD LOSS:  300cc DRAINS: 1 hemovac, 1 pain catheter COMPLICATIONS:  None.  PLAN OF CARE: Admit to inpatient   PATIENT DISPOSITION:  PACU - hemodynamically stable.   Delay start of  Pharmacological VTE agent (>24hrs) due to surgical blood loss or risk of bleeding:  no  Please fax a copy of this op note to my office at [phone] (please only include page 1 and 2 of the Case Information op note)

## 2014-05-14 NOTE — Progress Notes (Signed)
Occupational Therapy Evaluation Patient Details Name: Cheryl Wang MRN: [id number] DOB: 01/17/1951 Today's Date: 05/14/2014    History of Present Illness Patient is a 64 yo female admitted 05/13/14 now s/p Lt TKA.  PMH:  migraines, obesity, neck pain, DJD, Bil knee surgery   Clinical Impression   Patient presents to OT with expected decreased ADL independence s/p L TKA. All education regarding ADLs has been completed. No further OT needs; will sign off.    Follow Up Recommendations  No OT follow up;Supervision/Assistance - 24 hour    Equipment Recommendations  None recommended by OT (patient has all needed DME)    Recommendations for Other Services PT consult     Precautions / Restrictions Precautions Precautions: Knee Restrictions Weight Bearing Restrictions: Yes      Mobility Bed Mobility Overal bed mobility: Needs Assistance Bed Mobility: Supine to Sit     Supine to sit: Supervision     General bed mobility comments: increased time  Transfers Overall transfer level: Needs assistance Equipment used: Rolling walker (2 wheeled) Transfers: Sit to/from Stand Sit to Stand: Supervision         General transfer comment: increased time    Balance                                            ADL Overall ADL's : Needs assistance/impaired Eating/Feeding: Independent;Sitting   Grooming: Wash/dry hands;Wash/dry face;Standing;Supervision/safety   Upper Body Bathing: Set up;Sitting   Lower Body Bathing: Minimal assistance;Sit to/from stand   Upper Body Dressing : Set up;Sitting   Lower Body Dressing: Minimal assistance;Sit to/from stand   Toilet Transfer: Supervision/safety;Comfort height toilet;Ambulation;RW;Grab bars   Toileting- Clothing Manipulation and Hygiene: Supervision/safety;Sit to/from stand       Functional mobility during ADLs: Supervision/safety (increased time) General ADL Comments: Patient very motivated to participate.  Bed mobility supervision with increased time. Ambulated to/from bathroom, then to recliner with RW during session and supervision. Discussed LB dressing techniques and patient states her husband will assist as needed. Educated patient on tub transfer technique with tub seat. Patient verbalized understanding of technique. Educated patient on safety and that husband needed to be present during showering at home at first. Patient verbalized understanding. RN in to give pain medication at end of session and patient left up in recliner.     Vision     Perception     Praxis      Pertinent Vitals/Pain Pain Assessment: 0-10 Pain Score: 7  Pain Location: L knee Pain Descriptors / Indicators: Aching Pain Intervention(s): Limited activity within patient's tolerance;Monitored during session     Hand Dominance Right   Extremity/Trunk Assessment Upper Extremity Assessment Upper Extremity Assessment: Overall WFL for tasks assessed   Lower Extremity Assessment Lower Extremity Assessment: Defer to PT evaluation   Cervical / Trunk Assessment Cervical / Trunk Assessment: Normal   Communication Communication Communication: No difficulties   Cognition Arousal/Alertness: Awake/alert Behavior During Therapy: WFL for tasks assessed/performed Overall Cognitive Status: Within Functional Limits for tasks assessed                     General Comments       Exercises       Shoulder Instructions      Home Living Family/patient expects to be discharged to:: Private residence Living Arrangements: Spouse/significant other Available Help at Discharge: Family;Available 24 hours/day Type  of Home: House Home Access: Stairs to enter [employer] of Steps: 2 Entrance Stairs-Rails: None Home Layout: One level     Bathroom Shower/Tub: Chief Strategy Officer: Standard Bathroom Accessibility: Yes How Accessible: Accessible via walker Home Equipment: Walker - 2  wheels;Bedside commode;Shower seat          Prior Functioning/Environment Level of Independence: Independent             OT Diagnosis: Acute pain   OT Problem List: Decreased strength;Decreased range of motion;Decreased knowledge of use of DME or AE;Pain   OT Treatment/Interventions:      OT Goals(Current goals can be found in the care plan section) Acute Rehab OT Goals Patient Stated Goal: To go home soon  OT Frequency:     Barriers to D/C:            Co-evaluation              End of Session Equipment Utilized During Treatment: Rolling walker Nurse Communication: Mobility status  Activity Tolerance: Patient tolerated treatment well Patient left: in chair;with call bell/phone within reach;with nursing/sitter in room   Time: 0912-0944 OT Time Calculation (min): 32 min Charges:  OT General Charges $OT Visit: 1 Procedure OT Evaluation $Initial OT Evaluation Tier I: 1 Procedure OT Treatments $Self Care/Home Management : 8-22 mins G-Codes:    Kyriaki Moder A 2014-06-01, 9:51 AM

## 2014-05-14 NOTE — Progress Notes (Signed)
SPORTS MEDICINE AND JOINT REPLACEMENT  Georgena SpurlingStephen Lucey, MD   Altamese CabalMaurice Chandrika Sandles, PA-C [address], [place], [place]  [address]                             [phone]   PROGRESS NOTE  Subjective:  negative for Chest Pain  negative for Shortness of Breath  negative for Nausea/Vomiting   negative for Calf Pain  negative for Bowel Movement   Tolerating Diet: yes         Patient reports pain as 5 on 0-10 scale.    Objective: Vital signs in last 24 hours:   Patient Vitals for the past 24 hrs:  BP Temp Temp src Pulse Resp SpO2  05/14/14 1333 (!) 109/57 mmHg 98.6 F (37 C) Oral 98 16 96 %  05/14/14 0500 107/61 mmHg 98.1 F (36.7 C) - 83 16 99 %  05/14/14 0015 (!) 113/59 mmHg 97.8 F (36.6 C) - 81 16 100 %  05/13/14 2100 138/63 mmHg 98 F (36.7 C) - 79 16 94 %    @flow {1959:LAST@   Intake/Output from previous day:   03/14 0701 - 03/15 0700 In: 2340 [P.O.:240; I.V.:2100] Out: 225 [Urine:225]   Intake/Output this shift:       Intake/Output      03/14 0701 - 03/15 0700 03/15 0701 - 03/16 0700   P.O. 240    I.V. (mL/kg) 2100 (22.3)    Total Intake(mL/kg) 2340 (24.8)    Urine (mL/kg/hr) 225 (0.1)    Total Output 225     Net +2115          Urine Occurrence 2 x       LABORATORY DATA:  Recent Labs  05/13/14 1730 05/14/14 0545  WBC 16.9* 12.0*  HGB 13.6 12.1  HCT 41.0 37.5  PLT 303 280    Recent Labs  05/13/14 1730 05/14/14 0545  NA  --  140  K  --  4.8  CL  --  102  CO2  --  29  BUN  --  14  CREATININE 0.69 0.92  GLUCOSE  --  138*  CALCIUM  --  8.5   Lab Results  Component Value Date   INR 0.97 05/03/2014    Examination:  General appearance: alert, cooperative and no distress Extremities: Homans sign is negative, no sign of DVT  Wound Exam: clean, dry, intact   Drainage:  None: wound tissue dry  Motor Exam: EHL and FHL Intact  Sensory Exam: Deep Peroneal normal   Assessment:    1 Day Post-Op  Procedure(s) (LRB): LEFT TOTAL KNEE  ARTHROPLASTY (Left)  ADDITIONAL DIAGNOSIS:  Active Problems:   S/P total knee arthroplasty  Acute Blood Loss Anemia   Plan: Physical Therapy as ordered Weight Bearing as Tolerated (WBAT)  DVT Prophylaxis:  Lovenox  DISCHARGE PLAN: Home  DISCHARGE NEEDS: HHPT, CPM, Walker and 3-in-1 comode seat         Alonah Lineback 05/14/2014, 5:02 PM

## 2014-05-14 NOTE — Discharge Instructions (Signed)
Diet: As you were doing prior to hospitalization  ° °Activity:  Increase activity slowly as tolerated  °                No lifting or driving for 6 weeks ° °Shower:  May shower without a dressing once there is no drainage from your wound. °                Do NOT wash over the wound. °                °Dressing:  You may change your dressing on Wednesday °                   Then change the dressing daily with sterile 4"x4"s gauze dressing  °                   And TED hose for knees. ° °Weight Bearing:  Weight bearing as tolerated as taught in physical therapy.  Use a                                walker or Crutches as instructed. ° °To prevent constipation: you may use a stool softener such as - °              Colace ( over the counter) 100 mg by mouth twice a day  °              Drink plenty of fluids ( prune juice may be helpful) and high fiber foods °               Miralax ( over the counter) for constipation as needed.   ° °Precautions:  If you experience chest pain or shortness of breath - call 911 immediately               For transfer to the hospital emergency department!! °              If you develop a fever greater that 101 F, purulent drainage from wound,                             increased redness or drainage from wound, or calf pain -- Call the office. ° °Follow- Up Appointment:  Please call for an appointment to be seen on 05/28/14 °                                             [place] office:  [phone] °           [address] [place], [place] [address] °               ° ° °

## 2014-05-28 NOTE — Discharge Summary (Signed)
SPORTS MEDICINE & JOINT REPLACEMENT   Georgena SpurlingStephen Lucey, MD   Altamese CabalMaurice Jorah Hua, PA-C [address], [place], [place]  [address]                             [phone]  PATIENT ID: Cheryl Wang        MRN:  [id number]          DOB/AGE: 06-Apr-1950 / 64 y.o.    DISCHARGE SUMMARY  ADMISSION DATE:    05/13/2014 DISCHARGE DATE:   05/14/2014  ADMISSION DIAGNOSIS: primary osteoarthritis left knee    DISCHARGE DIAGNOSIS:  primary osteoarthritis left knee    ADDITIONAL DIAGNOSIS: Active Problems:   S/P total knee arthroplasty  Past Medical History  Diagnosis Date  . Chicken pox   . Hives   . Medical history non-contributory   . Arthritis     in hands  . Wears glasses   . Asthma     as a child  . Eczema     PROCEDURE: Procedure(s): LEFT TOTAL KNEE ARTHROPLASTY on 05/13/2014  CONSULTS:     HISTORY:  See H&P in chart  HOSPITAL COURSE:  Cheryl Wang is a 64 y.o. admitted on 05/13/2014 and found to have a diagnosis of primary osteoarthritis left knee.  After appropriate laboratory studies were obtained  they were taken to the operating room on 05/13/2014 and underwent Procedure(s): LEFT TOTAL KNEE ARTHROPLASTY.   They were given perioperative antibiotics:  Anti-infectives    Start     Dose/Rate Route Frequency Ordered Stop   05/13/14 1545  ceFAZolin (ANCEF) IVPB 2 g/50 mL premix     2 g 100 mL/hr over 30 Minutes Intravenous Every 6 hours 05/13/14 1544 05/13/14 2255   05/13/14 0745  ceFAZolin (ANCEF) IVPB 2 g/50 mL premix     2 g 100 mL/hr over 30 Minutes Intravenous  Once 05/13/14 0739 05/13/14 0912   05/13/14 0600  vancomycin (VANCOCIN) IVPB 1000 mg/200 mL premix  Status:  Discontinued     1,000 mg 200 mL/hr over 60 Minutes Intravenous On call to O.R. 05/12/14 1234 05/13/14 0739    .  Tolerated the procedure well.  Placed with a foley intraoperatively.  Given Ofirmev at induction and for 48 hours.    POD# 1: Vital signs were stable.  Patient denied Chest pain, shortness  of breath, or calf pain.  Patient was started on Lovenox 30 mg subcutaneously twice daily at 8am.  Consults to PT, OT, and care management were made.  The patient was weight bearing as tolerated.  CPM was placed on the operative leg 0-90 degrees for 6-8 hours a day.  Incentive spirometry was taught.  Dressing was changed.  Hemovac was discontinued.      POD #2, Continued  PT for ambulation and exercise program.  IV saline locked.  O2 discontinued.    The remainder of the hospital course was dedicated to ambulation and strengthening.   The patient was discharged on 1 day post op in  Good condition.  Blood products given:none  DIAGNOSTIC STUDIES: Recent vital signs: No data found.      Recent laboratory studies: No results for input(s): WBC, HGB, HCT, PLT in the last 168 hours. No results for input(s): NA, K, CL, CO2, BUN, CREATININE, GLUCOSE, CALCIUM in the last 168 hours. Lab Results  Component Value Date   INR 0.97 05/03/2014     Recent Radiographic Studies :  Dg Chest  2 View  05/03/2014   CLINICAL DATA:  Preop  EXAM: CHEST  2 VIEW  COMPARISON:  09/15/2005  FINDINGS: Normal heart size. Increased AP diameter of the chest. No pneumothorax. No pleural effusion. Normal vascularity. Stable mid-level thoracic compression deformity.  IMPRESSION: No active cardiopulmonary disease.   Electronically Signed   By: Jolaine Click M.D.   On: 05/03/2014 16:48    DISCHARGE INSTRUCTIONS: Discharge Instructions    CPM    Complete by:  As directed   Continuous passive motion machine (CPM):      Use the CPM from 0 to 90 for 6-8 hours per day.      You may increase by 10 per day.  You may break it up into 2 or 3 sessions per day.      Use CPM for 2 weeks or until you are told to stop.     Call MD / Call 911    Complete by:  As directed   If you experience chest pain or shortness of breath, CALL 911 and be transported to the hospital emergency room.  If you develope a fever above 101 F, pus (white  drainage) or increased drainage or redness at the wound, or calf pain, call your surgeon's office.     Change dressing    Complete by:  As directed   Change dressing on wednesday, then change the dressing daily with sterile 4 x 4 inch gauze dressing and apply TED hose.     Constipation Prevention    Complete by:  As directed   Drink plenty of fluids.  Prune juice may be helpful.  You may use a stool softener, such as Colace (over the counter) 100 mg twice a day.  Use MiraLax (over the counter) for constipation as needed.     Diet - low sodium heart healthy    Complete by:  As directed      Do not put a pillow under the knee. Place it under the heel.    Complete by:  As directed      Driving restrictions    Complete by:  As directed   No driving for 6 weeks     Increase activity slowly as tolerated    Complete by:  As directed      Lifting restrictions    Complete by:  As directed   No lifting for 6 weeks     TED hose    Complete by:  As directed   Use stockings (TED hose) for 2 weeks on both leg(s).  You may remove them at night for sleeping.           DISCHARGE MEDICATIONS:     Medication List    TAKE these medications        acetaminophen 500 MG tablet  Commonly known as:  TYLENOL  Take 1,000 mg by mouth every 6 (six) hours as needed.     Biotin 10 MG Tabs  Take 10 mg by mouth daily.     enoxaparin 40 MG/0.4ML injection  Commonly known as:  LOVENOX  Inject 0.4 mLs (40 mg total) into the skin daily.     ESTER C PO  Take 2 tablets by mouth daily.     GLUCOSAMINE HCL PO  Take 1 tablet by mouth 2 (two) times daily.     ibuprofen 200 MG tablet  Commonly known as:  ADVIL,MOTRIN  Take 200 mg by mouth 2 (two) times daily.     methocarbamol  500 MG tablet  Commonly known as:  ROBAXIN  Take 1-2 tablets (500-1,000 mg total) by mouth every 6 (six) hours as needed for muscle spasms.     multivitamin with minerals Tabs tablet  Take 1 tablet by mouth daily.      oxyCODONE 5 MG immediate release tablet  Commonly known as:  Oxy IR/ROXICODONE  Take 1-2 tablets (5-10 mg total) by mouth every 3 (three) hours as needed for breakthrough pain.     OxyCODONE 10 mg T12a 12 hr tablet  Commonly known as:  OXYCONTIN  Take 1 tablet (10 mg total) by mouth every 12 (twelve) hours.     zoster vaccine live (PF) 19400 UNT/0.65ML injection  Commonly known as:  ZOSTAVAX  Inject 19,400 Units into the skin once.        FOLLOW UP VISIT:       Follow-up Information    Follow up with [facility].   Why:  They will contact you to schedule home therapy visits.    Contact information:   [address] [place] [address] [phone]       Follow up with Raymon Mutton, MD. Call on 05/28/2014.   Specialty:  Orthopedic Surgery   Contact information:   [address] [place] [address] [phone]       DISPOSITION: HOME   CONDITION:  Good   Aime Carreras 05/28/2014, 3:05 PM

## 2014-06-09 ENCOUNTER — Encounter: Payer: Self-pay | Admitting: Family Medicine

## 2014-08-02 ENCOUNTER — Encounter: Payer: Self-pay | Admitting: *Deleted

## 2014-09-25 IMAGING — CR DG CERVICAL SPINE 2 OR 3 VIEWS
2 series · 2 of 2 positions shown · non-contrast
Comparison: None.

CLINICAL DATA: Neck pain.

CERVICAL SPINE - 2-3 VIEW

[lateral]
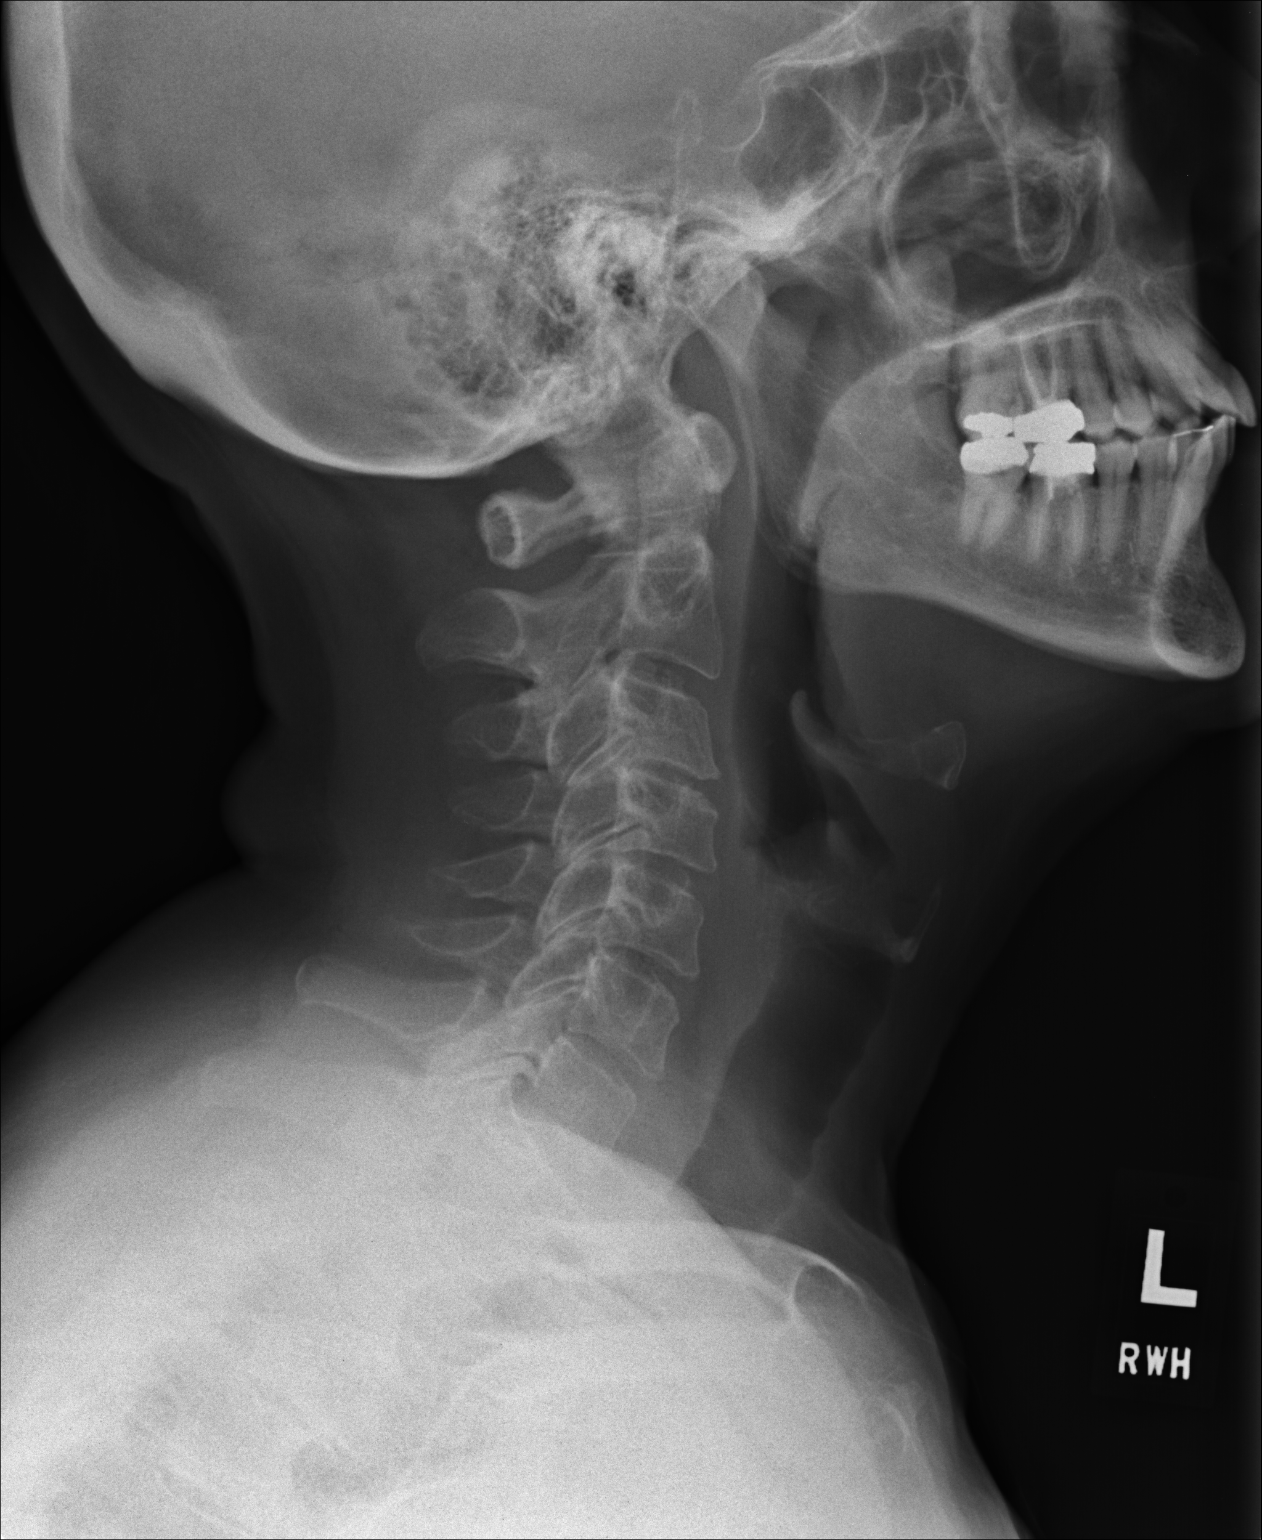

[AP]
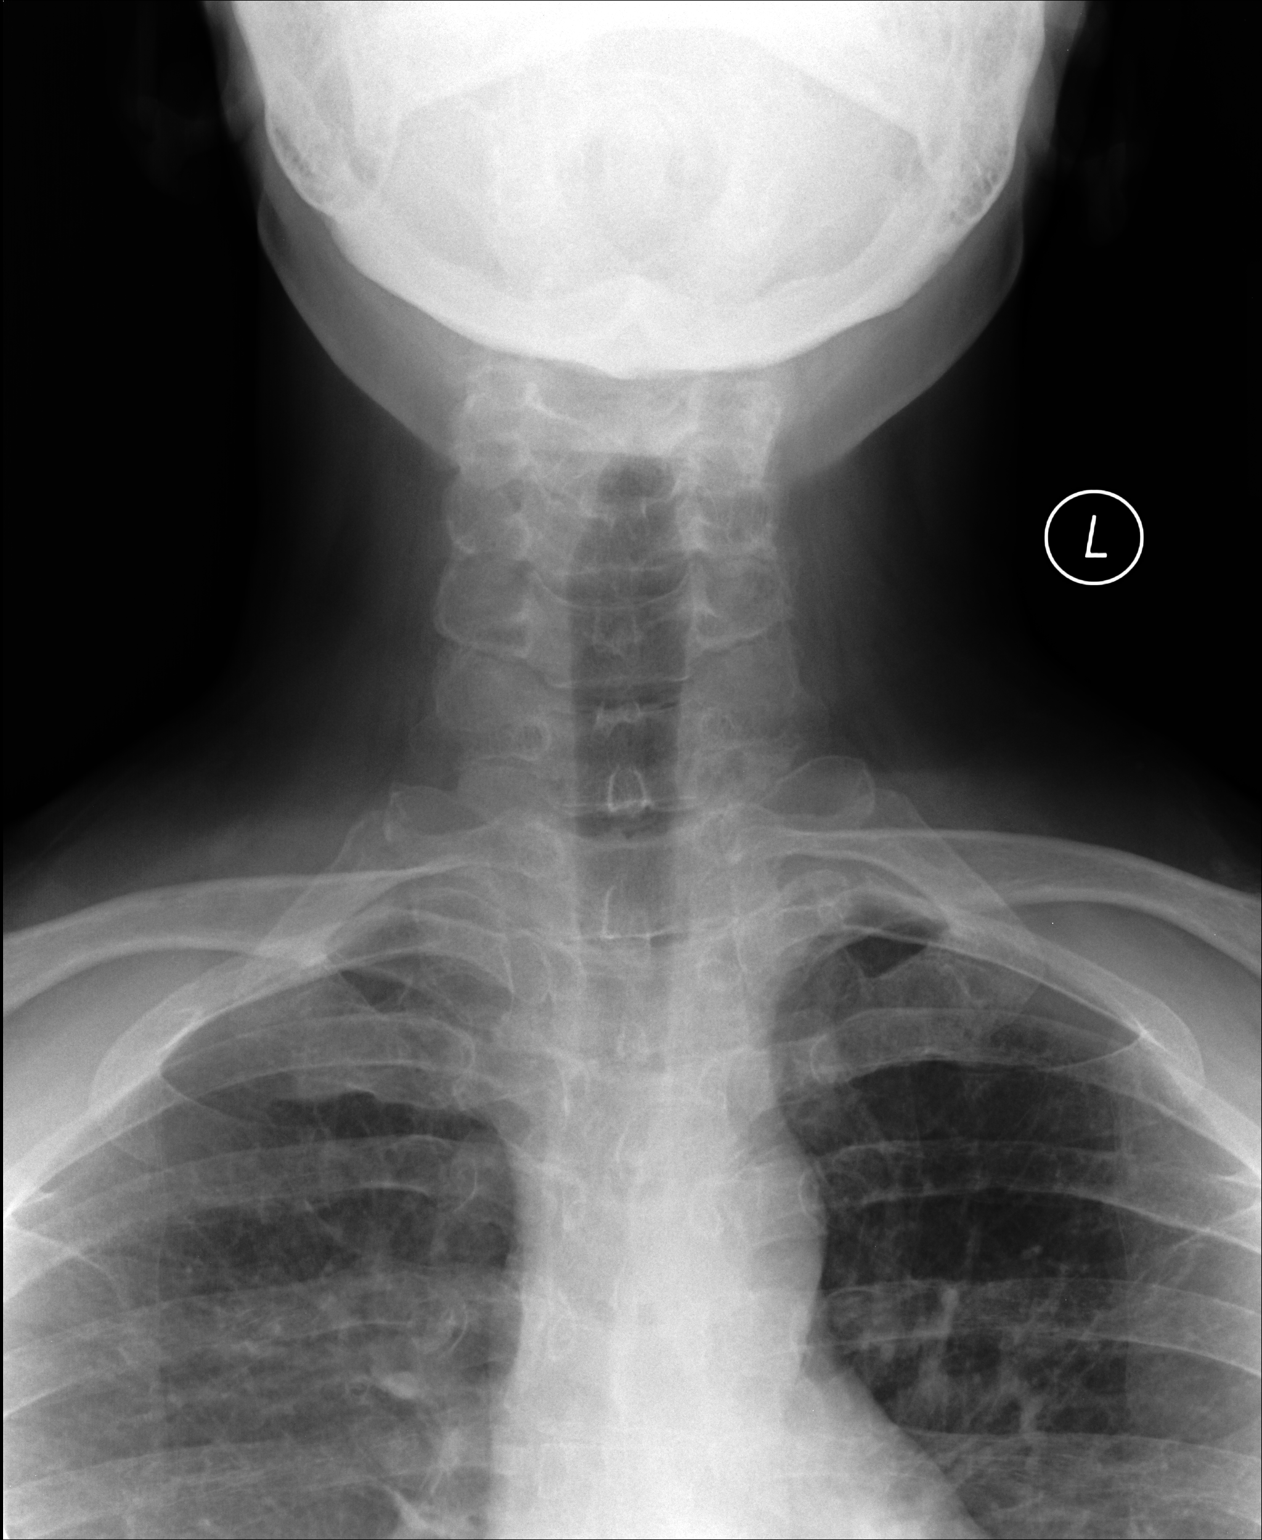

[2 of 2 positions shown; findings below may reference images not displayed]

FINDINGS: The cervical spine is visualized from the occiput to the
cervicothoracic junction.  Minimal retrolisthesis of C3 on C4,
measuring 2 mm.  There is endplate degenerative change and loss of
disc space height at this level as well.  Alignment is otherwise
anatomic.  Vertebral body height is maintained.  Prevertebral soft
tissues are within normal limits.

Mild multilevel uncovertebral hypertrophy and facet sclerosis.
Visualized lung apices are unremarkable.
IMPRESSION: Mild multilevel spondylosis, worst at C3-4.

Clinically significant discrepancy from primary report, if
provided: None

## 2015-02-11 ENCOUNTER — Ambulatory Visit (INDEPENDENT_AMBULATORY_CARE_PROVIDER_SITE_OTHER): Admitting: Internal Medicine

## 2015-02-11 ENCOUNTER — Telehealth: Payer: Self-pay | Admitting: Internal Medicine

## 2015-02-11 ENCOUNTER — Ambulatory Visit (INDEPENDENT_AMBULATORY_CARE_PROVIDER_SITE_OTHER)

## 2015-02-11 VITALS — BP 138/80 | HR 84 | Temp 97.9°F | Resp 18 | Ht 63.5 in | Wt 213.0 lb

## 2015-02-11 DIAGNOSIS — S60212A Contusion of left wrist, initial encounter: Secondary | ICD-10-CM

## 2015-02-11 DIAGNOSIS — Z23 Encounter for immunization: Secondary | ICD-10-CM

## 2015-02-11 DIAGNOSIS — S5292XA Unspecified fracture of left forearm, initial encounter for closed fracture: Secondary | ICD-10-CM

## 2015-02-11 NOTE — Telephone Encounter (Signed)
Ref to M-W

## 2015-02-11 NOTE — Progress Notes (Signed)
Subjective:  This chart was scribed for Ellamae Siaobert Kennidee Heyne, MD by Stann Oresung-Kai Tsai, Medical Scribe. This patient was seen in Room 8 and the patient's care was started at 2:39 PM.    Patient ID: Cheryl Wang, female    DOB: Dec 22, 1950, 64 y.o.   MRN: [id number] Chief Complaint  Patient presents with  . Fall    today   . Wrist Injury    left   . Laceration    rt, due td    HPI Cheryl Wang is a 64 y.o. female who [facility] complaining of sudden onset left wrist injury when she fell earlier today. She was holding something and she tripped over a curb. She has a laceration over her right wrist from a glass terrarium she was carrying.   She received a tdap today.   Patient Active Problem List   Diagnosis Date Noted  . S/P total knee arthroplasty 05/13/2014  . Hx of migraines 11/24/2012  . Left knee DJD 11/24/2012  . Right knee DJD 11/24/2012  . Obesity, unspecified 11/24/2012  . Neck pain 11/24/2012    Current outpatient prescriptions:  .  acetaminophen (TYLENOL) 500 MG tablet, Take 1,000 mg by mouth every 6 (six) hours as needed., Disp: , Rfl:  .  ibuprofen (ADVIL,MOTRIN) 200 MG tablet, Take 200 mg by mouth 2 (two) times daily., Disp: , Rfl:  .  Multiple Vitamin (MULTIVITAMIN WITH MINERALS) TABS tablet, Take 1 tablet by mouth daily., Disp: , Rfl:  .  Bioflavonoid Products (ESTER C PO), Take 2 tablets by mouth daily., Disp: , Rfl:  .  Biotin 10 MG TABS, Take 10 mg by mouth daily., Disp: , Rfl:  .  enoxaparin (LOVENOX) 40 MG/0.4ML injection, Inject 0.4 mLs (40 mg total) into the skin daily. (Patient not taking: Reported on 02/11/2015), Disp: 13 Syringe, Rfl: 0 .  GLUCOSAMINE HCL PO, Take 1 tablet by mouth 2 (two) times daily., Disp: , Rfl:  .  methocarbamol (ROBAXIN) 500 MG tablet, Take 1-2 tablets (500-1,000 mg total) by mouth every 6 (six) hours as needed for muscle spasms. (Patient not taking: Reported on 02/11/2015), Disp: 60 tablet, Rfl: 2 .  oxyCODONE (OXY IR/ROXICODONE) 5  MG immediate release tablet, Take 1-2 tablets (5-10 mg total) by mouth every 3 (three) hours as needed for breakthrough pain. (Patient not taking: Reported on 02/11/2015), Disp: 90 tablet, Rfl: 0 .  OxyCODONE (OXYCONTIN) 10 mg T12A 12 hr tablet, Take 1 tablet (10 mg total) by mouth every 12 (twelve) hours. (Patient not taking: Reported on 02/11/2015), Disp: 30 tablet, Rfl: 0 .  zoster vaccine live, PF, (ZOSTAVAX) [id number] UNT/0.65ML injection, Inject 19,400 Units into the skin once. (Patient not taking: Reported on 02/11/2015), Disp: 1 each, Rfl: 0  Review of Systems  Constitutional: Negative for fever, chills, diaphoresis and fatigue.  Musculoskeletal: Positive for myalgias and arthralgias (left wrist). Negative for gait problem.  Skin: Positive for wound (right wrist). Negative for rash.  Neurological: Negative for dizziness, weakness, numbness and headaches.      Objective:   Physical Exam  Constitutional: She is oriented to person, place, and time. She appears well-developed and well-nourished. No distress.  HENT:  Head: Normocephalic and atraumatic.  Eyes: EOM are normal. Pupils are equal, round, and reactive to light.  Neck: Neck supple.  Cardiovascular: Normal rate.   Pulmonary/Chest: Effort normal. No respiratory distress.  Musculoskeletal: Normal range of motion.  Left wrist: mildly swollen with tenderness to palpation and pain over distal radius  with rom   Neurological: She is alert and oriented to person, place, and time.  Skin: Skin is warm and dry.  Right wrist: right volar wrist superficial laceration  Psychiatric: She has a normal mood and affect. Her behavior is normal.  Nursing note and vitals reviewed.   BP 138/80 mmHg  Pulse 84  Temp(Src) 97.9 F (36.6 C) (Oral)  Resp 18  Ht 5' 3.5" (1.613 m)  Wt 213 lb (96.616 kg)  BMI 37.13 kg/m2  SpO2 98%  [facility] reading (PRIMARY) by Dr. Merla Riches : left wrist xray: impacted fx distal radius     Assessment & Plan:    Fracture, radius, left, closed, initial encounter From Contusion, wrist, left, initial encounter    Thumb spica for now and ref to Her ortho [facility] for definitive treatment    By signing my name below, I, Stann Ore, attest that this documentation has been prepared under the direction and in the presence of Ellamae Sia, MD. Electronically Signed: Stann Ore, Scribe. 02/11/2015 , 2:41 PM .  I have completed the patient encounter in its entirety as documented by the scribe, with editing by me where necessary. Future Yeldell P. Merla Riches, M.D.

## 2015-02-12 ENCOUNTER — Telehealth: Payer: Self-pay

## 2015-02-12 NOTE — Telephone Encounter (Signed)
Pt is on her way shortly to pick up a copy of the right wrist xrays she has an appt with ortho at 1030

## 2015-02-16 NOTE — Telephone Encounter (Signed)
Should have been done

## 2015-11-01 ENCOUNTER — Ambulatory Visit (INDEPENDENT_AMBULATORY_CARE_PROVIDER_SITE_OTHER): Admitting: Family Medicine

## 2015-11-01 VITALS — BP 136/82 | HR 64 | Temp 98.7°F | Resp 16 | Ht 63.5 in | Wt 186.0 lb

## 2015-11-01 DIAGNOSIS — M7062 Trochanteric bursitis, left hip: Secondary | ICD-10-CM

## 2015-11-01 DIAGNOSIS — Z131 Encounter for screening for diabetes mellitus: Secondary | ICD-10-CM | POA: Diagnosis not present

## 2015-11-01 DIAGNOSIS — Z114 Encounter for screening for human immunodeficiency virus [HIV]: Secondary | ICD-10-CM | POA: Diagnosis not present

## 2015-11-01 DIAGNOSIS — Z13220 Encounter for screening for lipoid disorders: Secondary | ICD-10-CM

## 2015-11-01 DIAGNOSIS — Z0000 Encounter for general adult medical examination without abnormal findings: Secondary | ICD-10-CM | POA: Diagnosis not present

## 2015-11-01 DIAGNOSIS — Z23 Encounter for immunization: Secondary | ICD-10-CM

## 2015-11-01 DIAGNOSIS — R21 Rash and other nonspecific skin eruption: Secondary | ICD-10-CM

## 2015-11-01 LAB — COMPLETE METABOLIC PANEL WITH GFR
ALBUMIN: 4.1 g/dL (ref 3.6–5.1)
ALK PHOS: 53 U/L (ref 33–130)
ALT: 13 U/L (ref 6–29)
AST: 20 U/L (ref 10–35)
BILIRUBIN TOTAL: 0.5 mg/dL (ref 0.2–1.2)
BUN: 18 mg/dL (ref 7–25)
CO2: 28 mmol/L (ref 20–31)
CREATININE: 0.72 mg/dL (ref 0.50–0.99)
Calcium: 9.2 mg/dL (ref 8.6–10.4)
Chloride: 107 mmol/L (ref 98–110)
GFR, Est African American: >89 mL/min (ref >=60)
GFR, Est Non African American: 89 mL/min (ref >=60)
GLUCOSE: 90 mg/dL (ref 65–99)
Potassium: 5.2 mmol/L (ref 3.5–5.3)
SODIUM: 140 mmol/L (ref 135–146)
TOTAL PROTEIN: 6.2 g/dL (ref 6.1–8.1)

## 2015-11-01 LAB — LIPID PANEL
Cholesterol: 215 mg/dL — ABNORMAL HIGH (ref 125–200)
HDL: 69 mg/dL (ref >=46)
LDL CALC: 128 mg/dL (ref <130)
Total CHOL/HDL Ratio: 3.1 Ratio (ref <=5.0)
Triglycerides: 90 mg/dL (ref <150)
VLDL: 18 mg/dL (ref <30)

## 2015-11-01 LAB — HIV ANTIBODY (ROUTINE TESTING W REFLEX): HIV: NONREACTIVE

## 2015-11-01 MED ORDER — ZOSTER VACCINE LIVE 19400 UNT/0.65ML ~~LOC~~ SUSR: 0.6500 mL | Freq: Once | SUBCUTANEOUS | 0 refills | Status: AC

## 2015-11-01 MED ORDER — TRIAMCINOLONE ACETONIDE 0.1 % EX CREA: 1.0000 | TOPICAL_CREAM | Freq: Two times a day (BID) | CUTANEOUS | 0 refills | Status: DC

## 2015-11-01 NOTE — Progress Notes (Signed)
By signing my name below I, Shelah Lewandowsky, attest that this documentation has been prepared under the direction and in the presence of Shade Flood, MD. Electonically Signed. Shelah Lewandowsky, Scribe 11/01/2015 at 11:48 AM  Subjective:    Patient ID: Cheryl Wang, female    DOB: [birth year], 65 y.o.   MRN: [id number]  Chief Complaint  Patient presents with  . Annual Exam    HPI Cheryl Wang is a 65 y.o. female who presents to the [facility] for her annual physical.   Pt reports pruritic rash on rt medial ankle.  Previous PCP was Dr Audria Nine.   Pt's last physical was in Feb 2016.   History chronic knee arthritis. Pt c/o joint issues today. Pt had total knee replacement a year ago on left knee. Pt has meniscus issues in rt knee. Pt started running again 3 months ago and intentionally losing weight. Pt reports having left hip pain with running that started a month ago. Pt denies increasing her work out routine at that time. Pt has been running 2 miles 2-3 times a week. Pt has mild discomfort with walking on left hip. Pt's mother has arthritis.  Pt denies unintentional weight loss, fevers, chills, or diaphoresis.   Pt reports that she exercises 4--5 times a week.   Cancer screening Pt's last colonoscopy was in June 2015 that was normal. Repeat in 10 years.   Pt reports that she is due for her next mammogram. Pt unsure when her last mammogram was.   Cervical cancer screening, normal pap Dec 2015. Pt has had a hysterectomy.   Pt denies any new moles or history of skin cancer.   Immunizations Immunization History  Administered Date(s) Administered  . Tdap 02/11/2015   Will discuss shingles and flu vaccine today. Pt states that she does not get the flu shot and does not want it.   Depression screening Depression screen [facility] 2/9 11/01/2015 02/11/2015 04/11/2014  Decreased Interest 0 0 0  Down, Depressed, Hopeless 0 0 0  PHQ - 2 Score 0 0 0   Vision  Visual  Acuity Screening   Right eye Left eye Both eyes  Without correction:     With correction: 20/20 20/20 20/20    Lipid screening Pt's lipid panel showed slight elevation in Sept 2014.   Hep C Screening Hep C antibody was negative in Feb 2016.   DM screening Elevated glucose 138 when last seen one year ago  Pt denies eating any food this morning.   Pt reports having a living will.   At the end of visit, pt reported HA radiating pain into her neck and shoulders. Based on timing of visit and multiple other issues addressed, pt was recommended to make a follow up visit at pt's next earliest convenience.    Patient Active Problem List   Diagnosis Date Noted  . S/P total knee arthroplasty 05/13/2014  . Hx of migraines 11/24/2012  . Left knee DJD 11/24/2012  . Right knee DJD 11/24/2012  . Obesity, unspecified 11/24/2012  . Neck pain 11/24/2012   Past Medical History:  Diagnosis Date  . Arthritis    in hands  . Asthma    as a child  . Chicken pox   . Eczema   . Hives   . Medical history non-contributory   . Wears glasses    Past Surgical History:  Procedure Laterality Date  . ABDOMINAL HYSTERECTOMY  2002   VAG HYST-  .  CARPAL TUNNEL RELEASE Left 08/30/2012   Procedure: CARPAL TUNNEL RELEASE;  Surgeon: Nicki ReaperGary R Kuzma, MD;  Location: [facility];  Service: Orthopedics;  Laterality: Left;  . COLONOSCOPY    . cyst removal  10/1988   on chest   . KNEE ARTHROSCOPY  1995   RIGHT AND LEFT  . KNEE SURGERY     Bil  . TONSILLECTOMY    . TOTAL KNEE ARTHROPLASTY Left 05/13/2014   Procedure: LEFT TOTAL KNEE ARTHROPLASTY;  Surgeon: Dannielle HuhSteve Lucey, MD;  Location: [facility];  Service: Orthopedics;  Laterality: Left;  . TRIGGER FINGER RELEASE Left 08/30/2012   Procedure: STS RELEASE LEFT THUMB;  Surgeon: Nicki ReaperGary R Kuzma, MD;  Location: [facility];  Service: Orthopedics;  Laterality: Left;   Allergies  Allergen Reactions  . Penicillins      Did not work effectively when  she had a skin graft in her mouth.   Prior to Admission medications   Medication Sig Start Date End Date Taking? Authorizing Provider  acetaminophen (TYLENOL) 500 MG tablet Take 1,000 mg by mouth every 6 (six) hours as needed.   Yes Historical Provider, MD  doxylamine, Sleep, (UNISOM) 25 MG tablet Take 25 mg by mouth at bedtime as needed.   Yes Historical Provider, MD  ibuprofen (ADVIL,MOTRIN) 200 MG tablet Take 200 mg by mouth 2 (two) times daily.   Yes Historical Provider, MD   Social History   Social History  . Marital status: Married    Spouse name: N/A  . Number of children: N/A  . Years of education: N/A   Occupational History  . Retired.    Social History Main Topics  . Smoking status: Former Smoker    Types: Cigarettes    Quit date: 08/26/1978  . Smokeless tobacco: Never Used  . Alcohol use No     Comment: OCC  . Drug use: No  . Sexual activity: Yes   Other Topics Concern  . Not on file   Social History Narrative   Married   College   Does not work   Research officer, political partyWalks for exercise 2x's weekly.      Review of Systems  Constitutional: Negative for fever.  Musculoskeletal: Positive for arthralgias (left hip) and neck pain.  Skin: Positive for rash (rt medial ankle).  Neurological: Positive for headaches.  All other systems reviewed and are negative.      Objective:   Physical Exam  Constitutional: She is oriented to person, place, and time. She appears well-developed and well-nourished.  HENT:  Head: Normocephalic and atraumatic.  Right Ear: External ear normal.  Left Ear: External ear normal.  Mouth/Throat: Oropharynx is clear and moist.  Eyes: Conjunctivae are normal. Pupils are equal, round, and reactive to light.  Neck: Normal range of motion. Neck supple. No thyromegaly present.  Cardiovascular: Normal rate, regular rhythm, normal heart sounds and intact distal pulses.   No murmur heard. Pulmonary/Chest: Effort normal and breath sounds normal. No respiratory  distress. She has no wheezes.  Abdominal: Soft. Bowel sounds are normal. There is no tenderness.  Musculoskeletal: Normal range of motion. She exhibits no edema or tenderness.  Left hip no pain with internal or external rotation. Pt's left troch bursa is tender.  Lymphadenopathy:    She has no cervical adenopathy.  Neurological: She is alert and oriented to person, place, and time.  Skin: Skin is warm and dry. No rash noted.  Pt has a papular rash with a few excoriated areas on her  rt medial ankle.  Psychiatric: She has a normal mood and affect. Her behavior is normal. Thought content normal.  Vitals reviewed.   Vitals:   11/01/15 1035  BP: 136/82  Pulse: 64  Resp: 16  Temp: 98.7 F (37.1 C)  TempSrc: Oral  SpO2: 96%  Weight: 186 lb (84.4 kg)  Height: 5' 3.5" (1.613 m)         Assessment & Plan:    Cheryl Wang is a 65 y.o. female Annual physical exam  - -anticipatory guidance as below in AVS, screening labs above. Health maintenance items as above in HPI discussed/recommended as applicable.   Trochanteric bursitis of left hip  - Based on location, suspected trochanteric bursitis. Doubt stress injury or DJD based on this location.   - Injection discussed, but can try home exercises first, recheck in 2 weeks for possible steroid injection or follow-up with orthopedist.  consider x-ray at next visit if not improving.  Rash and nonspecific skin eruption - Plan: triamcinolone cream (KENALOG) 0.1 %  - Possible contact dermatitis with secondary excoriation. Trial of triamcinolone cream for itching, recheck in 2 weeks if not improving.  Screening for hyperlipidemia - Plan: COMPLETE METABOLIC PANEL WITH GFR, Lipid panel  Screening for diabetes mellitus - Plan: Hemoglobin A1C  Need for shingles vaccine - Plan: Zoster Vaccine Live, PF, (ZOSTAVAX) 04540 UNT/0.65ML injection  - Prescription printed, patient would like to check into this further prior to injection.  Screening  for HIV (human immunodeficiency virus) - Plan: HIV antibody  Advised to follow-up to discuss her neck pain and headache, as this has been a long-standing issue. Briefly discussed and she has seen neurology or headache specialist in the past, but would prefer to see different provider. RTC precautions if any acute worsening.  Meds ordered this encounter  Medications  . doxylamine, Sleep, (UNISOM) 25 MG tablet    Sig: Take 25 mg by mouth at bedtime as needed.  . Zoster Vaccine Live, PF, (ZOSTAVAX) 98119 UNT/0.65ML injection    Sig: Inject 19,400 Units into the skin once.    Dispense:  1 each    Refill:  0  . triamcinolone cream (KENALOG) 0.1 %    Sig: Apply 1 application topically 2 (two) times daily.    Dispense:  30 g    Refill:  0   Patient Instructions   Schedule mammogram.  Let me know if you need a referral.   Let me know if you change your mind about the flu shot.  Try the steroid cream for your ankle rash and if not improving in next 2 weeks can look into other causes or treatments.   Your hip pain appears to be due to trochanteric bursitis. See instructions and exercises on this below. Follow-up in 2 weeks if this is not improving for possible injection. You can also follow-up with your orthopedist for this area if you prefer. If you have pain with weightbearing, or the pain is not improving in next 2 weeks, you also may need an x-ray as we discussed.   Please return to discuss neck pain and headaches further as we may need some time to do x-rays or other evaluation.  Return to the clinic or go to the nearest emergency room if any of your symptoms worsen or new symptoms occur.  Trochanteric Bursitis You have hip pain due to trochanteric bursitis. Bursitis means that the sack near the outside of the hip is filled with fluid and inflamed. This sack is  made up of protective soft tissue. The pain from trochanteric bursitis can be severe and keep you from sleep. It can radiate to  the buttocks or down the outside of the thigh to the knee. The pain is almost always worse when rising from the seated or lying position and with walking. Pain can improve after you take a few steps. It happens more often in people with hip joint and lumbar spine problems, such as arthritis or previous surgery. Very rarely the trochanteric bursa can become infected, and antibiotics and/or surgery may be needed. Treatment often includes an injection of local anesthetic mixed with cortisone medicine. This medicine is injected into the area where it is most tender over the hip. Repeat injections may be necessary if the response to treatment is slow. You can apply ice packs over the tender area for 30 minutes every 2 hours for the next few days. Anti-inflammatory and/or narcotic pain medicine may also be helpful. Limit your activity for the next few days if the pain continues. See your caregiver in 5-10 days if you are not greatly improved.  SEEK IMMEDIATE MEDICAL CARE IF:  You develop severe pain, fever, or increased redness.  You have pain that radiates below the knee. EXERCISES STRETCHING EXERCISES - Trochanteric Bursitis  These exercises may help you when beginning to rehabilitate your injury. Your symptoms may resolve with or without further involvement from your physician, physical therapist, or athletic trainer. While completing these exercises, remember:   Restoring tissue flexibility helps normal motion to return to the joints. This allows healthier, less painful movement and activity.  An effective stretch should be held for at least 30 seconds.  A stretch should never be painful. You should only feel a gentle lengthening or release in the stretched tissue. STRETCH - Iliotibial Band  On the floor or bed, lie on your side so your injured leg is on top. Bend your knee and grab your ankle.  Slowly bring your knee back so that your thigh is in line with your trunk. Keep your heel at your  buttocks and gently arch your back so your head, shoulders and hips line up.  Slowly lower your leg so that your knee approaches the floor/bed until you feel a gentle stretch on the outside of your thigh. If you do not feel a stretch and your knee will not fall farther, place the heel of your opposite foot on top of your knee and pull your thigh down farther.  Hold this stretch for __________ seconds.  Repeat __________ times. Complete this exercise __________ times per day. STRETCH - Hamstrings, Supine   Lie on your back. Loop a belt or towel over the ball of your foot as shown.  Straighten your knee and slowly pull on the belt to raise your injured leg. Do not allow the knee to bend. Keep your opposite leg flat on the floor.  Raise the leg until you feel a gentle stretch behind your knee or thigh. Hold this position for __________ seconds.  Repeat __________ times. Complete this stretch __________ times per day. STRETCH - Quadriceps, Prone   Lie on your stomach on a firm surface, such as a bed or padded floor.  Bend your knee and grasp your ankle. If you are unable to reach your ankle or pant leg, use a belt around your foot to lengthen your reach.  Gently pull your heel toward your buttocks. Your knee should not slide out to the side. You should feel a stretch  in the front of your thigh and/or knee.  Hold this position for __________ seconds.  Repeat __________ times. Complete this stretch __________ times per day. STRETCHING - Hip Flexors, Lunge Half kneel with your knee on the floor and your opposite knee bent and directly over your ankle.  Keep good posture with your head over your shoulders. Tighten your buttocks to point your tailbone downward; this will prevent your back from arching too much.  You should feel a gentle stretch in the front of your thigh and/or hip. If you do not feel any resistance, slightly slide your opposite foot forward and then slowly lunge forward so  your knee once again lines up over your ankle. Be sure your tailbone remains pointed downward.  Hold this stretch for __________ seconds.  Repeat __________ times. Complete this stretch __________ times per day. STRETCH - Adductors, Lunge  While standing, spread your legs.  Lean away from your injured leg by bending your opposite knee. You may rest your hands on your thigh for balance.  You should feel a stretch in your inner thigh. Hold for __________ seconds.  Repeat __________ times. Complete this exercise __________ times per day.   This information is not intended to replace advice given to you by your health care provider. Make sure you discuss any questions you have with your health care provider.   Document Released: 03/25/2004 Document Revised: 07/02/2014 Document Reviewed: 05/30/2008 Elsevier Interactive Patient Education 2016 [employer].   Keeping You Healthy  Get These Tests  Blood Pressure- Have your blood pressure checked by your healthcare provider at least once a year.  Normal blood pressure is 120/80.  Weight- Have your body mass index (BMI) calculated to screen for obesity.  BMI is a measure of body fat based on height and weight.  You can calculate your own BMI at https://www.west-esparza.com/  Cholesterol- Have your cholesterol checked every year.  Diabetes- Have your blood sugar checked every year if you have high blood pressure, high cholesterol, a family history of diabetes or if you are overweight.  Pap Test - Have a pap test every 1 to 5 years if you have been sexually active.  If you are older than 65 and recent pap tests have been normal you may not need additional pap tests.  In addition, if you have had a hysterectomy  for benign disease additional pap tests are not necessary.  Mammogram-Yearly mammograms are essential for early detection of breast cancer  Screening for Colon Cancer- Colonoscopy starting at age 29. Screening may begin sooner depending  on your family history and other health conditions.  Follow up colonoscopy as directed by your Gastroenterologist.  Screening for Osteoporosis- Screening begins at age 20 with bone density scanning, sooner if you are at higher risk for developing Osteoporosis.  Get these medicines  Calcium with Vitamin D- Your body requires 1200-1500 mg of Calcium a day and [phone] IU of Vitamin D a day.  You can only absorb 500 mg of Calcium at a time therefore Calcium must be taken in 2 or 3 separate doses throughout the day.  Hormones- Hormone therapy has been associated with increased risk for certain cancers and heart disease.  Talk to your healthcare provider about if you need relief from menopausal symptoms.  Aspirin- Ask your healthcare provider about taking Aspirin to prevent Heart Disease and Stroke.  Get these Immuniztions  Flu shot- Every fall  Pneumonia shot- Once after the age of 73; if you are younger ask your  healthcare provider if you need a pneumonia shot.  Tetanus- Every ten years.  Zostavax- Once after the age of 71 to prevent shingles.  Take these steps  Don't smoke- Your healthcare provider can help you quit. For tips on how to quit, ask your healthcare provider or go to www.smokefree.gov or call 1-800 QUIT-NOW.  Be physically active- Exercise 5 days a week for a minimum of 30 minutes.  If you are not already physically active, start slow and gradually work up to 30 minutes of moderate physical activity.  Try walking, dancing, bike riding, swimming, etc.  Eat a healthy diet- Eat a variety of healthy foods such as fruits, vegetables, whole grains, low fat milk, low fat cheeses, yogurt, lean meats, chicken, fish, eggs, dried beans, tofu, etc.  For more information go to www.thenutritionsource.org  Dental visit- Brush and floss teeth twice daily; visit your dentist twice a year.  Eye exam- Visit your Optometrist or Ophthalmologist yearly.  Drink alcohol in moderation- Limit  alcohol intake to one drink or less a day.  Never drink and drive.  Depression- Your emotional health is as important as your physical health.  If you're feeling down or losing interest in things you normally enjoy, please talk to your healthcare provider.  Seat Belts- can save your life; always wear one  Smoke/Carbon Monoxide detectors- These detectors need to be installed on the appropriate level of your home.  Replace batteries at least once a year.  Violence- If anyone is threatening or hurting you, please tell your healthcare provider.  Living Will/ Health care power of attorney- Discuss with your healthcare provider and family.   IF you received an x-ray today, you will receive an invoice from [facility]. Please contact [facility] at [phone] with questions or concerns regarding your invoice.   IF you received labwork today, you will receive an invoice from [employer]. Please contact [facility] at [phone] with questions or concerns regarding your invoice.   Our billing staff will not be able to assist you with questions regarding bills from these companies.  You will be contacted with the lab results as soon as they are available. The fastest way to get your results is to activate your My Chart account. Instructions are located on the last page of this paperwork. If you have not heard from [place] regarding the results in 2 weeks, please contact this office.     I personally performed the services described in this documentation, which was scribed in my presence. The recorded information has been reviewed and considered, and addended by me as needed.   Signed,   Meredith Staggers, MD [facility].  11/01/15 5:08 PM

## 2015-11-01 NOTE — Patient Instructions (Addendum)
Schedule mammogram.  Let me know if you need a referral.   Let me know if you change your mind about the flu shot.  Try the steroid cream for your ankle rash and if not improving in next 2 weeks can look into other causes or treatments.   Your hip pain appears to be due to trochanteric bursitis. See instructions and exercises on this below. Follow-up in 2 weeks if this is not improving for possible injection. You can also follow-up with your orthopedist for this area if you prefer. If you have pain with weightbearing, or the pain is not improving in next 2 weeks, you also may need an x-ray as we discussed.   Please return to discuss neck pain and headaches further as we may need some time to do x-rays or other evaluation.  Return to the clinic or go to the nearest emergency room if any of your symptoms worsen or new symptoms occur.  Trochanteric Bursitis You have hip pain due to trochanteric bursitis. Bursitis means that the sack near the outside of the hip is filled with fluid and inflamed. This sack is made up of protective soft tissue. The pain from trochanteric bursitis can be severe and keep you from sleep. It can radiate to the buttocks or down the outside of the thigh to the knee. The pain is almost always worse when rising from the seated or lying position and with walking. Pain can improve after you take a few steps. It happens more often in people with hip joint and lumbar spine problems, such as arthritis or previous surgery. Very rarely the trochanteric bursa can become infected, and antibiotics and/or surgery may be needed. Treatment often includes an injection of local anesthetic mixed with cortisone medicine. This medicine is injected into the area where it is most tender over the hip. Repeat injections may be necessary if the response to treatment is slow. You can apply ice packs over the tender area for 30 minutes every 2 hours for the next few days. Anti-inflammatory and/or narcotic  pain medicine may also be helpful. Limit your activity for the next few days if the pain continues. See your caregiver in 5-10 days if you are not greatly improved.  SEEK IMMEDIATE MEDICAL CARE IF:  You develop severe pain, fever, or increased redness.  You have pain that radiates below the knee. EXERCISES STRETCHING EXERCISES - Trochanteric Bursitis  These exercises may help you when beginning to rehabilitate your injury. Your symptoms may resolve with or without further involvement from your physician, physical therapist, or athletic trainer. While completing these exercises, remember:   Restoring tissue flexibility helps normal motion to return to the joints. This allows healthier, less painful movement and activity.  An effective stretch should be held for at least 30 seconds.  A stretch should never be painful. You should only feel a gentle lengthening or release in the stretched tissue. STRETCH - Iliotibial Band  On the floor or bed, lie on your side so your injured leg is on top. Bend your knee and grab your ankle.  Slowly bring your knee back so that your thigh is in line with your trunk. Keep your heel at your buttocks and gently arch your back so your head, shoulders and hips line up.  Slowly lower your leg so that your knee approaches the floor/bed until you feel a gentle stretch on the outside of your thigh. If you do not feel a stretch and your knee will not fall farther,  place the heel of your opposite foot on top of your knee and pull your thigh down farther.  Hold this stretch for __________ seconds.  Repeat __________ times. Complete this exercise __________ times per day. STRETCH - Hamstrings, Supine   Lie on your back. Loop a belt or towel over the ball of your foot as shown.  Straighten your knee and slowly pull on the belt to raise your injured leg. Do not allow the knee to bend. Keep your opposite leg flat on the floor.  Raise the leg until you feel a gentle  stretch behind your knee or thigh. Hold this position for __________ seconds.  Repeat __________ times. Complete this stretch __________ times per day. STRETCH - Quadriceps, Prone   Lie on your stomach on a firm surface, such as a bed or padded floor.  Bend your knee and grasp your ankle. If you are unable to reach your ankle or pant leg, use a belt around your foot to lengthen your reach.  Gently pull your heel toward your buttocks. Your knee should not slide out to the side. You should feel a stretch in the front of your thigh and/or knee.  Hold this position for __________ seconds.  Repeat __________ times. Complete this stretch __________ times per day. STRETCHING - Hip Flexors, Lunge Half kneel with your knee on the floor and your opposite knee bent and directly over your ankle.  Keep good posture with your head over your shoulders. Tighten your buttocks to point your tailbone downward; this will prevent your back from arching too much.  You should feel a gentle stretch in the front of your thigh and/or hip. If you do not feel any resistance, slightly slide your opposite foot forward and then slowly lunge forward so your knee once again lines up over your ankle. Be sure your tailbone remains pointed downward.  Hold this stretch for __________ seconds.  Repeat __________ times. Complete this stretch __________ times per day. STRETCH - Adductors, Lunge  While standing, spread your legs.  Lean away from your injured leg by bending your opposite knee. You may rest your hands on your thigh for balance.  You should feel a stretch in your inner thigh. Hold for __________ seconds.  Repeat __________ times. Complete this exercise __________ times per day.   This information is not intended to replace advice given to you by your health care provider. Make sure you discuss any questions you have with your health care provider.   Document Released: 03/25/2004 Document Revised: 07/02/2014  Document Reviewed: 05/30/2008 Elsevier Interactive Patient Education 2016 [employer].   Keeping You Healthy  Get These Tests  Blood Pressure- Have your blood pressure checked by your healthcare provider at least once a year.  Normal blood pressure is 120/80.  Weight- Have your body mass index (BMI) calculated to screen for obesity.  BMI is a measure of body fat based on height and weight.  You can calculate your own BMI at https://www.west-esparza.com/  Cholesterol- Have your cholesterol checked every year.  Diabetes- Have your blood sugar checked every year if you have high blood pressure, high cholesterol, a family history of diabetes or if you are overweight.  Pap Test - Have a pap test every 1 to 5 years if you have been sexually active.  If you are older than 65 and recent pap tests have been normal you may not need additional pap tests.  In addition, if you have had a hysterectomy  for benign disease  additional pap tests are not necessary.  Mammogram-Yearly mammograms are essential for early detection of breast cancer  Screening for Colon Cancer- Colonoscopy starting at age 15. Screening may begin sooner depending on your family history and other health conditions.  Follow up colonoscopy as directed by your Gastroenterologist.  Screening for Osteoporosis- Screening begins at age 66 with bone density scanning, sooner if you are at higher risk for developing Osteoporosis.  Get these medicines  Calcium with Vitamin D- Your body requires 1200-1500 mg of Calcium a day and [id number] IU of Vitamin D a day.  You can only absorb 500 mg of Calcium at a time therefore Calcium must be taken in 2 or 3 separate doses throughout the day.  Hormones- Hormone therapy has been associated with increased risk for certain cancers and heart disease.  Talk to your healthcare provider about if you need relief from menopausal symptoms.  Aspirin- Ask your healthcare provider about taking Aspirin to prevent  Heart Disease and Stroke.  Get these Immuniztions  Flu shot- Every fall  Pneumonia shot- Once after the age of 25; if you are younger ask your healthcare provider if you need a pneumonia shot.  Tetanus- Every ten years.  Zostavax- Once after the age of 40 to prevent shingles.  Take these steps  Don't smoke- Your healthcare provider can help you quit. For tips on how to quit, ask your healthcare provider or go to www.smokefree.gov or call 1-800 QUIT-NOW.  Be physically active- Exercise 5 days a week for a minimum of 30 minutes.  If you are not already physically active, start slow and gradually work up to 30 minutes of moderate physical activity.  Try walking, dancing, bike riding, swimming, etc.  Eat a healthy diet- Eat a variety of healthy foods such as fruits, vegetables, whole grains, low fat milk, low fat cheeses, yogurt, lean meats, chicken, fish, eggs, dried beans, tofu, etc.  For more information go to www.thenutritionsource.org  Dental visit- Brush and floss teeth twice daily; visit your dentist twice a year.  Eye exam- Visit your Optometrist or Ophthalmologist yearly.  Drink alcohol in moderation- Limit alcohol intake to one drink or less a day.  Never drink and drive.  Depression- Your emotional health is as important as your physical health.  If you're feeling down or losing interest in things you normally enjoy, please talk to your healthcare provider.  Seat Belts- can save your life; always wear one  Smoke/Carbon Monoxide detectors- These detectors need to be installed on the appropriate level of your home.  Replace batteries at least once a year.  Violence- If anyone is threatening or hurting you, please tell your healthcare provider.  Living Will/ Health care power of attorney- Discuss with your healthcare provider and family.   IF you received an x-ray today, you will receive an invoice from [facility]. Please contact [facility] at [phone]  with questions or concerns regarding your invoice.   IF you received labwork today, you will receive an invoice from [employer]. Please contact [facility] at [phone] with questions or concerns regarding your invoice.   Our billing staff will not be able to assist you with questions regarding bills from these companies.  You will be contacted with the lab results as soon as they are available. The fastest way to get your results is to activate your My Chart account. Instructions are located on the last page of this paperwork. If you have not heard from [place] regarding the results  in 2 weeks, please contact this office.

## 2015-11-02 LAB — HEMOGLOBIN A1C
HEMOGLOBIN A1C: 5.1 % (ref <5.7)
MEAN PLASMA GLUCOSE: 100 mg/dL

## 2016-03-03 IMAGING — CR DG CHEST 2V
2 series · 2 of 2 positions shown · non-contrast
Comparison: 09/15/2005

CLINICAL DATA: Preop

EXAM:
CHEST  2 VIEW

[w chest pa]
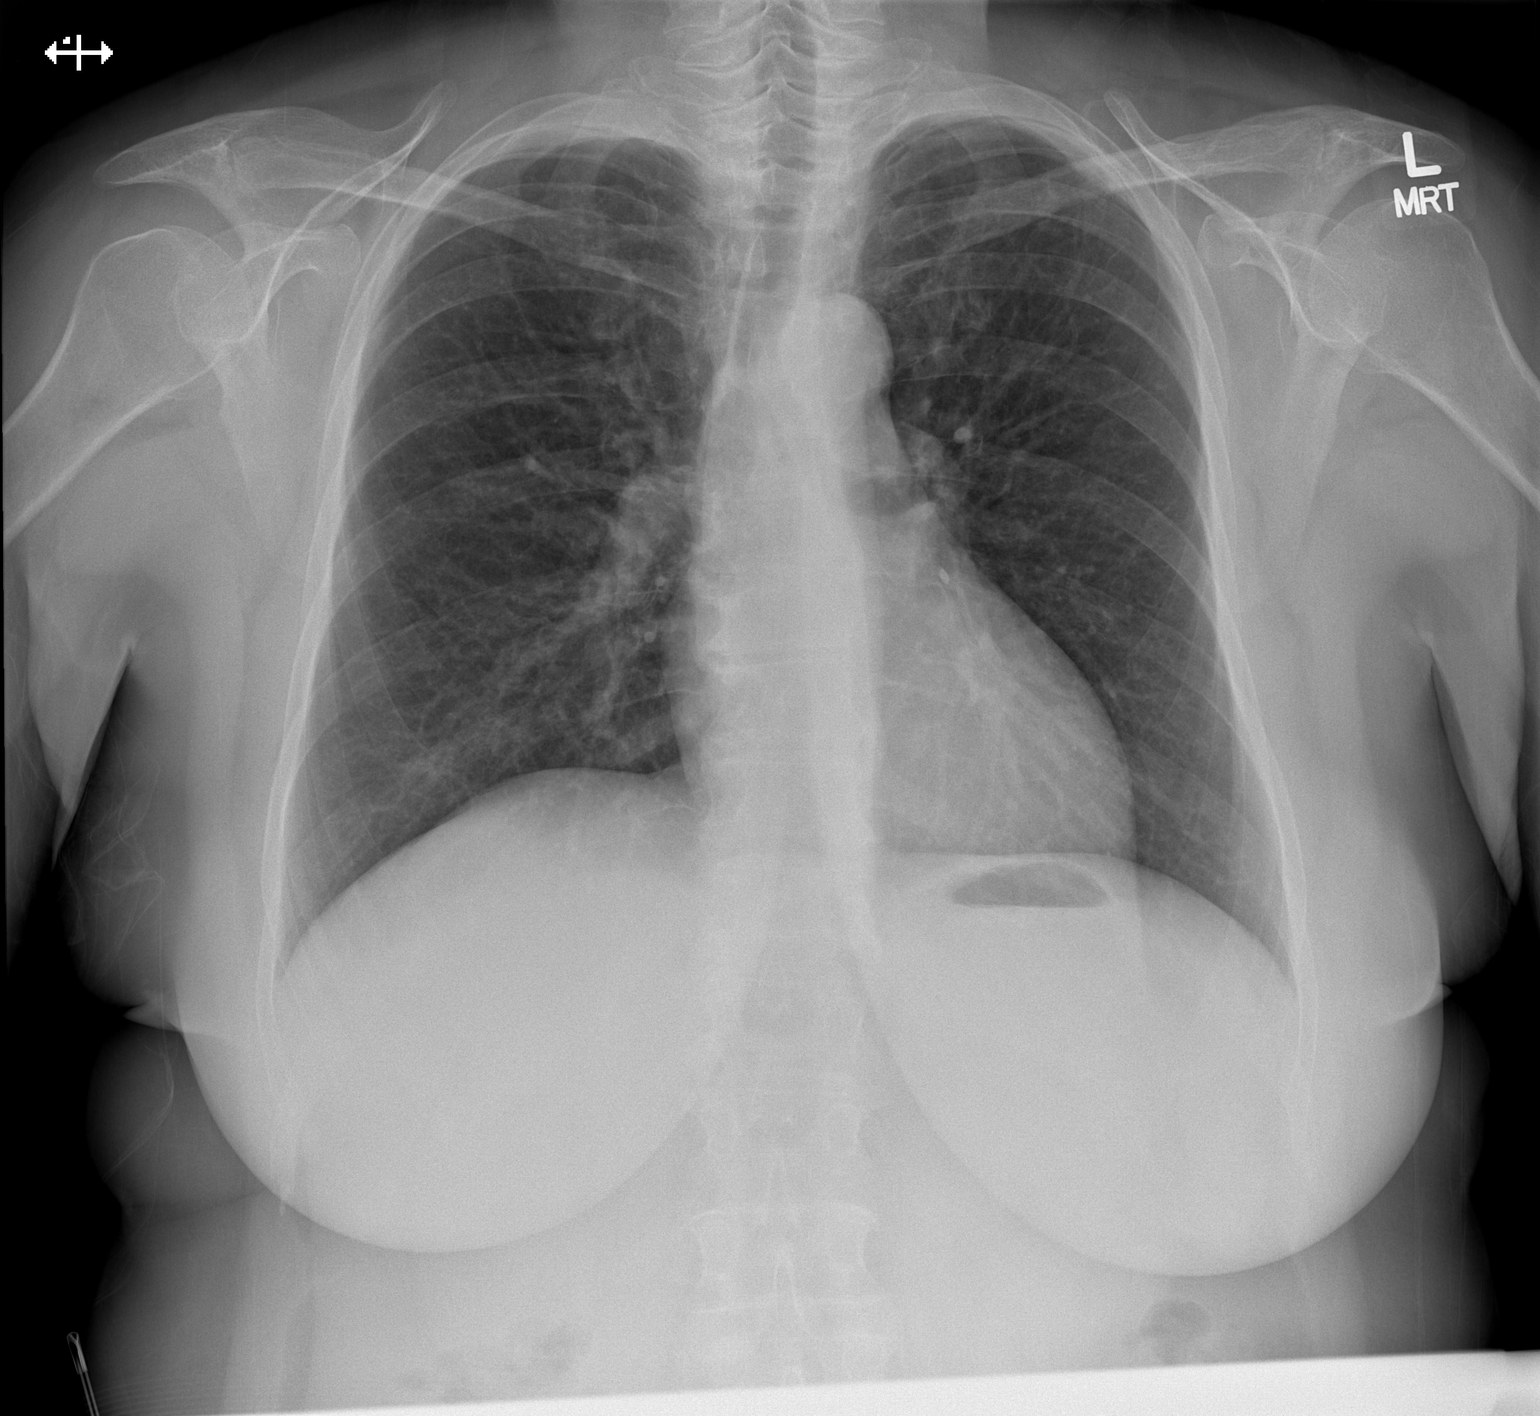

[w chest lat]
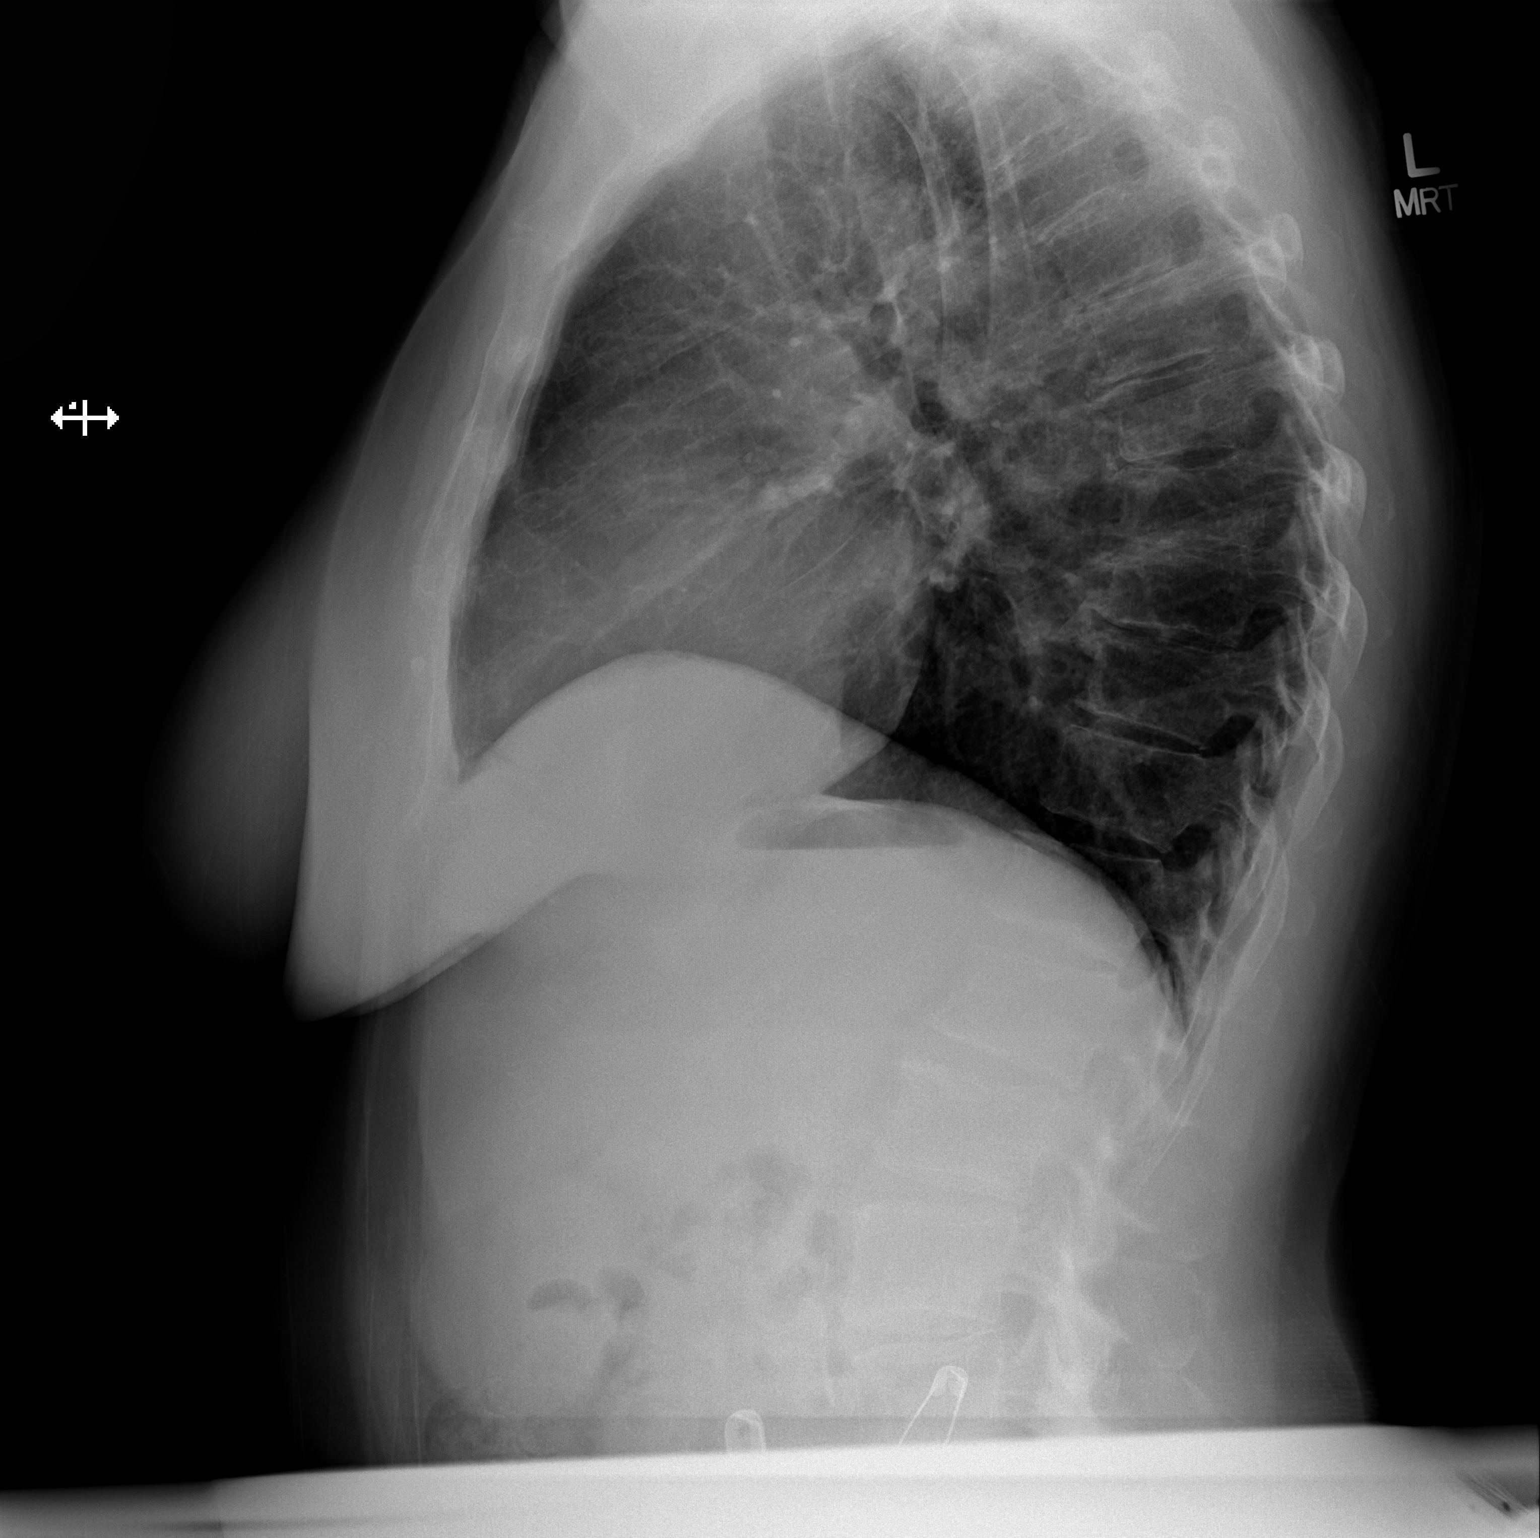

[2 of 2 positions shown; findings below may reference images not displayed]

FINDINGS: Normal heart size. Increased AP diameter of the chest. No
pneumothorax. No pleural effusion. Normal vascularity. Stable
mid-level thoracic compression deformity.
IMPRESSION: No active cardiopulmonary disease.

## 2016-04-02 ENCOUNTER — Ambulatory Visit (INDEPENDENT_AMBULATORY_CARE_PROVIDER_SITE_OTHER): Admitting: Physician Assistant

## 2016-04-02 VITALS — BP 134/84 | HR 77 | Temp 98.1°F | Resp 17 | Ht 63.5 in | Wt 203.0 lb

## 2016-04-02 DIAGNOSIS — G8929 Other chronic pain: Secondary | ICD-10-CM | POA: Diagnosis not present

## 2016-04-02 DIAGNOSIS — M25532 Pain in left wrist: Secondary | ICD-10-CM | POA: Diagnosis not present

## 2016-04-02 DIAGNOSIS — M25531 Pain in right wrist: Secondary | ICD-10-CM | POA: Diagnosis not present

## 2016-04-02 DIAGNOSIS — M25512 Pain in left shoulder: Secondary | ICD-10-CM | POA: Diagnosis not present

## 2016-04-02 NOTE — Progress Notes (Signed)
Patient ID: Cheryl Wang, female    DOB: [birth year], 66 y.o.   MRN: [id number]  PCP: Shade FloodGREENE,JEFFREY R, MD  Chief Complaint  Patient presents with  . Hand Pain  . Shoulder Pain    Subjective:   Presents for evaluation of bilateral thumb pain.  She has known DJD of the knees and neck. S/P LEFT carpal tunnel release and trigger. Also s/p LEFT radial fracture 01/2015 and known DJD of both knees, s/p LEFT TKR and cervical spondylosis.  For the past 3-4 months, she has noted gradually worsening pain in the thumbs and wrists and in the LEFT shoulder. Pain is worst with lifting her grandchildren, by which she puts her hands under their arms. The pain radiates up the arms and is progressively worse during each day, and is worse following household chores. Occasionally has paresthesia in the fingertips.  She is getting ready to start working with a trainer, who asked her to get clearance.    Review of Systems As above. No CP, SOB, HA, dizziness.    Patient Active Problem List   Diagnosis Date Noted  . S/P total knee arthroplasty 05/13/2014  . Hx of migraines 11/24/2012  . Left knee DJD 11/24/2012  . Right knee DJD 11/24/2012  . Obesity, unspecified 11/24/2012  . Spondylosis, cervical 11/24/2012     Prior to Admission medications   Medication Sig Start Date End Date Taking? Authorizing Provider  acetaminophen (TYLENOL) 500 MG tablet Take 1,000 mg by mouth every 6 (six) hours as needed.   Yes Historical Provider, MD  calcium carbonate 1250 MG capsule Take 1,250 mg by mouth 2 (two) times daily with a meal.   Yes Historical Provider, MD  cholecalciferol (VITAMIN D) 1000 units tablet Take 1,000 Units by mouth daily.   Yes Historical Provider, MD  doxylamine, Sleep, (UNISOM) 25 MG tablet Take 25 mg by mouth at bedtime as needed.   Yes Historical Provider, MD  ELDERBERRY PO Take by mouth.   Yes Historical Provider, MD  Ginkgo Biloba 100 MG CAPS Take by mouth.   Yes Historical  Provider, MD  ibuprofen (ADVIL,MOTRIN) 200 MG tablet Take 200 mg by mouth 2 (two) times daily.   Yes Historical Provider, MD  Multiple Vitamins-Minerals (MULTIVITAMIN WITH MINERALS) tablet Take 1 tablet by mouth daily.   Yes Historical Provider, MD  triamcinolone cream (KENALOG) 0.1 % Apply 1 application topically 2 (two) times daily. 11/01/15  Yes Shade FloodJeffrey R Greene, MD     Allergies  Allergen Reactions  . Penicillins      Did not work effectively when she had a skin graft in her mouth.       Objective:  Physical Exam  Constitutional: She is oriented to person, place, and time. She appears well-developed and well-nourished. She is active and cooperative. No distress.  BP 134/84 (BP Location: Right Arm, Patient Position: Sitting, Cuff Size: Normal)   Pulse 77   Temp 98.1 F (36.7 C) (Oral)   Resp 17   Ht 5' 3.5" (1.613 m)   Wt 203 lb (92.1 kg)   SpO2 99%   BMI 35.40 kg/m   HENT:  Head: Normocephalic and atraumatic.  Right Ear: Hearing normal.  Left Ear: Hearing normal.  Eyes: Conjunctivae are normal. No scleral icterus.  Neck: Normal range of motion. Neck supple. No thyromegaly present.  Cardiovascular: Normal rate, regular rhythm and normal heart sounds.   Pulses:      Radial pulses are 2+ on the right side,  and 2+ on the left side.  Pulmonary/Chest: Effort normal and breath sounds normal.  Musculoskeletal:       Right wrist: She exhibits tenderness. She exhibits normal range of motion and no bony tenderness.       Left wrist: She exhibits tenderness. She exhibits normal range of motion and no bony tenderness.       Right forearm: Normal.       Left forearm: Normal.       Right hand: She exhibits tenderness. Normal sensation noted. Normal strength noted.       Left hand: She exhibits tenderness. She exhibits normal range of motion. Normal sensation noted. Normal strength noted.  Finkelstein's test reproduces symptoms bilaterally.  Lymphadenopathy:       Head (right side):  No tonsillar, no preauricular, no posterior auricular and no occipital adenopathy present.       Head (left side): No tonsillar, no preauricular, no posterior auricular and no occipital adenopathy present.    She has no cervical adenopathy.       Right: No supraclavicular adenopathy present.       Left: No supraclavicular adenopathy present.  Neurological: She is alert and oriented to person, place, and time. No sensory deficit.  Skin: Skin is warm, dry and intact. No rash noted. No cyanosis or erythema. Nails show no clubbing.  Psychiatric: She has a normal mood and affect. Her speech is normal and behavior is normal.    Declines seasonal influenza vaccine.       Assessment & Plan:   1. Bilateral wrist pain Likely DeQuervain's tenosynovitis. Bilateral thumb spica splints. NSAIDS. If persists, plan referral back to orthopedics. - Thumb spica  2. Chronic left shoulder pain Possibly due to chronic neck issues, possibly aggravated by thumb pain. NSAIDS.  Proceed with physical training. Use pain as a guide. If symptoms worsen/persist, RTC or see orthopedics.    Fernande Bras, PA-C Physician Assistant-Certified Primary Care at [facility]

## 2016-04-02 NOTE — Progress Notes (Signed)
Patient ID: Cheryl Wang, female    DOB: February 09, 1951, 66 y.o.   MRN: [id number]  PCP: Shade FloodGREENE,JEFFREY R, MD  Chief Complaint  Patient presents with  . Hand Pain  . Shoulder Pain    Subjective:   Presents for evaluation of hand and shoulder pain.  Pt is a 66 yo caucasian, right-hand-dominant female who presents with 3-4 months of gradually worsening bilateral wrist and thumb pain and left shoulder pain. 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 She states that she first noticed the pain when picking her 2 yo grandson up by placing her hands under his arms and "almost dropped him" because of pain in her thumbs and wrists. The pain radiates from her thumbs into her lower arms. The pain gets worse throughout the day and after activities, especially "tedious" tasks like wrapping presents and cleaning. She also complains of occasional numbness in her finger tips. Pt denies recent trauma. Pt had left carpal tunnel surgery in July 2014 and a left radial fracture in Dec 2016. Her mother had rheumatoid arthritis and she is worried she may be developing it, too. Pt states that she would like to start working out with a trainer, but her trainer wants her to be cleared with her wrist pain first.   Pt also complains of "tension" in her shoulders and left shoulder pain. Denies stress. Pt has a history of multilevel spondylosis, worst at C3-4 per xray 11/24/2012.  Pt also complains of occasional morning occipital  headaches. They are relieved with Advil/tylenol. She states that she has a history of migraines >2 years ago. These headaches are not associated with changes in vision, photophobia, phonophobia, dizziness, nausea, or vomiting.    Review of Systems In addition to that stated in HPI above: Const: Denies fever, chills, fatigue, stress, or weight loss. Skin: Denies rashes. HEENT: Denies conjunctivitis. Pulm: Denies cough or SOB. CV:  Denies chest pain or palpitations.  GU: Denies dysuria or hematuria.   Patient Active Problem List   Diagnosis Date Noted  . S/P total knee arthroplasty 05/13/2014  . Hx of migraines 11/24/2012  . Left knee DJD 11/24/2012  . Right knee DJD 11/24/2012  . Obesity, unspecified 11/24/2012  . Neck pain 11/24/2012     Prior to Admission medications   Medication Sig Start Date End Date Taking? Authorizing Provider  acetaminophen (TYLENOL) 500 MG tablet Take 1,000 mg by mouth every 6 (six) hours as needed.   Yes Historical Provider, MD  calcium carbonate 1250 MG capsule Take 1,250 mg by mouth 2 (two) times daily with a meal.   Yes Historical Provider, MD  cholecalciferol (VITAMIN D) 1000 units tablet Take 1,000 Units by mouth daily.   Yes Historical Provider, MD  doxylamine, Sleep, (UNISOM) 25 MG tablet Take 25 mg by mouth at bedtime as needed.   Yes Historical Provider, MD  ELDERBERRY PO Take by mouth.   Yes Historical Provider, MD  Ginkgo Biloba 100 MG CAPS Take by mouth.   Yes Historical Provider, MD  ibuprofen (ADVIL,MOTRIN) 200 MG tablet Take 200 mg by mouth 2 (two) times daily.   Yes Historical Provider, MD  Multiple Vitamins-Minerals (MULTIVITAMIN WITH MINERALS) tablet Take 1 tablet by mouth daily.   Yes Historical Provider, MD  triamcinolone cream (KENALOG) 0.1 % Apply 1 application topically 2 (two) times daily. 11/01/15  Yes Shade FloodJeffrey R Greene, MD     Allergies  Allergen Reactions  .  Penicillins      Did not work effectively when she had a skin graft in her mouth.       Objective:  Physical Exam  Skin: No rashes or discolorations noted.  Pulm: Good respiratory effort. CTAB. No wheezes, rales, or rhonchi. CV: RRR. No M/R/G. MSK: No swelling, masses, or atrophy noted on arms, wrists, or hands bilaterally. No tenderness to palpation of arms, wrists, or hands bilaterally. Full ROM at wrist, mild pain with extension bilaterally. + Finkelstein Test. 2+ radial pulses, equal  bilaterally.  Assessment & Plan:   1. Bilateral wrist pain Possibly due to DeQuervain's Tendonitis. Pt advised to wear bilateral thumb spica splints as much as possible and take a daily NSAID like ibuprofen (with food to avoid GI upset). Pt may gradually begin exercise training, but should advise trainer of pain and avoid activities that cause pain.  - Thumb spica (Bilateral)  2. Chronic left shoulder pain Pain may be due to compensation from wrist pain. Pt advised to return if pain does not resolve with resolution of wrist pain.  Georgiana Spinner, PA-S

## 2016-04-02 NOTE — Patient Instructions (Addendum)
Your pain may be due to a condition called DeQuervain's Tendonitis.   Wear the wrist splints on both wrists as much as possible. Take a daily over-the-counter NSAID like Ibuprofen. Be sure to take with food to avoid stomach upset. You may begin a slow introduction to exercise training, but be sure to make trainer aware of wrist pain and don't over-exert yourself or do anything that causes you pain.  Call or return to the office if your symptoms persist or worsen, or if new or worrisome symptoms arise.   IF you received an x-ray today, you will receive an [facility]. Please contact [facility] at [phone] with questions or concerns regarding your invoice.   IF you received labwork today, you will receive an invoice from [facility]. Please contact [facility] at [phone] with questions or concerns regarding your invoice.   Our billing staff will not be able to assist you with questions regarding bills from these companies.  You will be contacted with the lab results as soon as they are available. The fastest way to get your results is to activate your My Chart account. Instructions are located on the last page of this paperwork. If you have not heard from us regarding the results in 2 weeks, please contact this office.

## 2016-06-24 ENCOUNTER — Ambulatory Visit (INDEPENDENT_AMBULATORY_CARE_PROVIDER_SITE_OTHER): Admitting: Family Medicine

## 2016-06-24 VITALS — BP 135/74 | HR 78 | Temp 98.5°F | Resp 16 | Ht 63.0 in | Wt 210.0 lb

## 2016-06-24 DIAGNOSIS — Z87891 Personal history of nicotine dependence: Secondary | ICD-10-CM

## 2016-06-24 DIAGNOSIS — R05 Cough: Secondary | ICD-10-CM

## 2016-06-24 DIAGNOSIS — R053 Chronic cough: Secondary | ICD-10-CM

## 2016-06-24 NOTE — Progress Notes (Signed)
Cheryl Wang is a 66 y.o. female who presents to Primary Care at [facility] today with a question about being screened for lung cancer:  1.  Screening for lung cancer:  Patient is a prior 3 pack per day smoker for 5 years. She quit smoking about 30 years ago. She evidently has strong history of inhaled drug use redness in time. She will like to be screened for lung cancer. She states that she has a relative who is previously healthy and diagnosed with lung cancer and died within 3 months. This was recently.  She herself has had no weight loss, no fevers or chills, no night sweats. She does have a history of chronic mild cough present in the morning. Nonproductive. She has a past history of asthma but last asthma attack was when she was a teenager. She denies any reflux or postnasal drip.  ROS as above.    PMH reviewed. Patient is a nonsmoker.   Past Medical History:  Diagnosis Date  . Arthritis    in hands  . Asthma    as a child  . Chicken pox   . Eczema   . Hives   . Medical history non-contributory   . Spondylosis 11/24/2012   per xray  . Wears glasses    Past Surgical History:  Procedure Laterality Date  . ABDOMINAL HYSTERECTOMY  2002   VAG HYST-  . CARPAL TUNNEL RELEASE Left 08/30/2012   Procedure: CARPAL TUNNEL RELEASE;  Surgeon: Nicki Reaper, MD;  Location: [facility];  Service: Orthopedics;  Laterality: Left;  . COLONOSCOPY    . cyst removal  10/1988   on chest   . KNEE ARTHROSCOPY  1995   RIGHT AND LEFT  . KNEE SURGERY     Bil  . TONSILLECTOMY    . TOTAL KNEE ARTHROPLASTY Left 05/13/2014   Procedure: LEFT TOTAL KNEE ARTHROPLASTY;  Surgeon: Dannielle Huh, MD;  Location: [facility];  Service: Orthopedics;  Laterality: Left;  . TRIGGER FINGER RELEASE Left 08/30/2012   Procedure: STS RELEASE LEFT THUMB;  Surgeon: Nicki Reaper, MD;  Location: [facility];  Service: Orthopedics;  Laterality: Left;    Medications reviewed. Current Outpatient Prescriptions   Medication Sig Dispense Refill  . acetaminophen (TYLENOL) 500 MG tablet Take 1,000 mg by mouth every 6 (six) hours as needed.    . calcium carbonate 1250 MG capsule Take 1,250 mg by mouth 2 (two) times daily with a meal.    . cholecalciferol (VITAMIN D) 1000 units tablet Take 1,000 Units by mouth daily.    Marland Kitchen doxylamine, Sleep, (UNISOM) 25 MG tablet Take 50 mg by mouth at bedtime as needed.     Marland Kitchen ELDERBERRY PO Take by mouth.    . Ginkgo Biloba 100 MG CAPS Take by mouth.    Marland Kitchen ibuprofen (ADVIL,MOTRIN) 200 MG tablet Take 200 mg by mouth 2 (two) times daily.    . Multiple Vitamins-Minerals (MULTIVITAMIN WITH MINERALS) tablet Take 1 tablet by mouth daily.    Marland Kitchen triamcinolone cream (KENALOG) 0.1 % Apply 1 application topically 2 (two) times daily. 30 g 0   No current facility-administered medications for this visit.      Physical Exam:  BP 135/74   Pulse 78   Temp 98.5 F (36.9 C) (Oral)   Resp 16   Ht  (1.6 m)   Wt 210 lb (95.3 kg)   SpO2 98%   BMI 37.20 kg/m  Gen:  Alert, cooperative  patient who appears stated age in no acute distress.  Vital signs reviewed. HEENT: EOMI,  MMM Pulm:  Clear to auscultation bilaterally with good air movement.  No wheezes or rales noted.   Cardiac:  Regular rate and rhythm without murmur auscultated.  Good S1/S2. Ext:  No LE edema  Assessment and Plan:  1.  Former smoker: - plan to screen using low dose CT for lung cancer.  - will set up patient for CT scan and call with appt - plan to also call with results.    2.  Chronic cough: - if normal CT scan, she should FU with PCP to further discuss. - as above, no history reflux/PND.  Distant history of asthma.  Lungs good today.

## 2016-06-24 NOTE — Patient Instructions (Addendum)
It was very good to meet you today.  Someone will contact you with the time of your CT scan.  After you have the test done, we will also contact you with the results.    Have a great weekend  IF you received an x-ray today, you will receive an invoice from [facility]. Please contact [facility] at [phone] with questions or concerns regarding your invoice.   IF you received labwork today, you will receive an invoice from [place]. Please contact [facility] at [phone] with questions or concerns regarding your invoice.   Our billing staff will not be able to assist you with questions regarding bills from these companies.  You will be contacted with the lab results as soon as they are available. The fastest way to get your results is to activate your My Chart account. Instructions are located on the last page of this paperwork. If you have not heard from [place] regarding the results in 2 weeks, please contact this office.

## 2016-07-02 ENCOUNTER — Other Ambulatory Visit: Payer: Self-pay | Admitting: Family Medicine

## 2016-07-02 ENCOUNTER — Ambulatory Visit
Admission: RE | Admit: 2016-07-02 | Discharge: 2016-07-02 | Disposition: A | Discharge status: Home / Self Care | Source: Ambulatory Visit | Attending: Family Medicine | Admitting: Family Medicine

## 2016-07-02 DIAGNOSIS — R05 Cough: Secondary | ICD-10-CM

## 2016-07-02 DIAGNOSIS — R053 Chronic cough: Secondary | ICD-10-CM

## 2016-07-02 DIAGNOSIS — Z87891 Personal history of nicotine dependence: Secondary | ICD-10-CM

## 2016-07-13 ENCOUNTER — Encounter: Payer: Self-pay | Admitting: Family Medicine

## 2016-07-22 NOTE — Telephone Encounter (Signed)
Can someone call and ask [facility] why they have not read the CT scan for this patient dated on 07/02/16?  The patient is calling to hear the results.  Thanks!  JW

## 2016-08-01 ENCOUNTER — Encounter: Payer: Self-pay | Admitting: Family Medicine

## 2016-09-21 ENCOUNTER — Encounter: Payer: Self-pay | Admitting: Family Medicine

## 2016-09-21 ENCOUNTER — Ambulatory Visit (INDEPENDENT_AMBULATORY_CARE_PROVIDER_SITE_OTHER): Admitting: Family Medicine

## 2016-09-21 VITALS — BP 123/70 | HR 81 | Temp 98.2°F | Resp 16 | Ht 63.0 in | Wt 202.0 lb

## 2016-09-21 DIAGNOSIS — M545 Low back pain, unspecified: Secondary | ICD-10-CM

## 2016-09-21 DIAGNOSIS — R350 Frequency of micturition: Secondary | ICD-10-CM | POA: Diagnosis not present

## 2016-09-21 LAB — POC URINALSYSI DIPSTICK (AUTOMATED)
BILIRUBIN UA: NEGATIVE
Glucose, UA: NEGATIVE
KETONES UA: NEGATIVE
Leukocytes, UA: NEGATIVE
NITRITE UA: NEGATIVE
Protein, UA: NEGATIVE
Spec Grav, UA: 1.02 (ref 1.010–1.025)
Urobilinogen, UA: 0.2 E.U./dL
pH, UA: 5 (ref 5.0–8.0)

## 2016-09-21 LAB — POC MICROSCOPIC URINALYSIS (UMFC): Mucus: ABSENT

## 2016-09-21 MED ORDER — CYCLOBENZAPRINE HCL 5 MG PO TABS: 5.0000 mg | ORAL_TABLET | Freq: Three times a day (TID) | ORAL | 0 refills | Status: DC | PRN

## 2016-09-21 MED ORDER — NITROFURANTOIN MONOHYD MACRO 100 MG PO CAPS: 100.0000 mg | ORAL_CAPSULE | Freq: Two times a day (BID) | ORAL | 0 refills | Status: DC

## 2016-09-21 MED ORDER — MELOXICAM 7.5 MG PO TABS
7.5000 mg | ORAL_TABLET | Freq: Every day | ORAL | 0 refills | Status: DC
Start: 2016-09-21 — End: 2017-04-05

## 2016-09-21 NOTE — Patient Instructions (Addendum)
Back pain appears to be muscular at this time. Try meloxicam once per day, Flexeril 1-2 pills up to 3 times per day. Do not drive or operate machinery while taking Flexeril. Recheck in the next 2 days if not showing some signs of improvement, or sooner if worsening including fevers, abdominal pain, nausea, vomiting.  I will check a urine culture, but you can start the antibiotic for now for possible urinary tract infection with the urinary symptoms. In office testing was overall reassuring.  Return to the clinic or go to the nearest emergency room if any of your symptoms worsen or new symptoms occur.   Back Pain, Adult Back pain is very common in adults.The cause of back pain is rarely dangerous and the pain often gets better over time.The cause of your back pain may not be known. Some common causes of back pain include:  Strain of the muscles or ligaments supporting the spine.  Wear and tear (degeneration) of the spinal disks.  Arthritis.  Direct injury to the back.  For many people, back pain may return. Since back pain is rarely dangerous, most people can learn to manage this condition on their own. Follow these instructions at home: Watch your back pain for any changes. The following actions may help to lessen any discomfort you are feeling:  Remain active. It is stressful on your back to sit or stand in one place for long periods of time. Do not sit, drive, or stand in one place for more than 30 minutes at a time. Take short walks on even surfaces as soon as you are able.Try to increase the length of time you walk each day.  Exercise regularly as directed by your health care provider. Exercise helps your back heal faster. It also helps avoid future injury by keeping your muscles strong and flexible.  Do not stay in bed.Resting more than 1-2 days can delay your recovery.  Pay attention to your body when you bend and lift. The most comfortable positions are those that put less  stress on your recovering back. Always use proper lifting techniques, including: ? Bending your knees. ? Keeping the load close to your body. ? Avoiding twisting.  Find a comfortable position to sleep. Use a firm mattress and lie on your side with your knees slightly bent. If you lie on your back, put a pillow under your knees.  Avoid feeling anxious or stressed.Stress increases muscle tension and can worsen back pain.It is important to recognize when you are anxious or stressed and learn ways to manage it, such as with exercise.  Take medicines only as directed by your health care provider. Over-the-counter medicines to reduce pain and inflammation are often the most helpful.Your health care provider may prescribe muscle relaxant drugs.These medicines help dull your pain so you can more quickly return to your normal activities and healthy exercise.  Apply ice to the injured area: ? Put ice in a plastic bag. ? Place a towel between your skin and the bag. ? Leave the ice on for 20 minutes, 2-3 times a day for the first 2-3 days. After that, ice and heat may be alternated to reduce pain and spasms.  Maintain a healthy weight. Excess weight puts extra stress on your back and makes it difficult to maintain good posture.  Contact a health care provider if:  You have pain that is not relieved with rest or medicine.  You have increasing pain going down into the legs or buttocks.  You  have pain that does not improve in one week.  You have night pain.  You lose weight.  You have a fever or chills. Get help right away if:  You develop new bowel or bladder control problems.  You have unusual weakness or numbness in your arms or legs.  You develop nausea or vomiting.  You develop abdominal pain.  You feel faint. This information is not intended to replace advice given to you by your health care provider. Make sure you discuss any questions you have with your health care  provider. Document Released: 02/15/2005 Document Revised: 06/26/2015 Document Reviewed: 06/19/2013 Elsevier Interactive Patient Education  2017 [employer]     IF you received an x-ray today, you will receive an invoice from [facility]. Please contact [facility] at [phone] with questions or concerns regarding your invoice.   IF you received labwork today, you will receive an invoice from [facility]. Please contact [facility] at [phone] with questions or concerns regarding your invoice.   Our billing staff will not be able to assist you with questions regarding bills from these companies.  You will be contacted with the lab results as soon as they are available. The fastest way to get your results is to activate your My Chart account. Instructions are located on the last page of this paperwork. If you have not heard from us regarding the results in 2 weeks, please contact this office.

## 2016-09-21 NOTE — Progress Notes (Addendum)
Subjective:  By signing my name below, I, Essence Howell, attest that this documentation has been prepared under the direction and in the presence of Cheryl FloodJeffrey R Quinnlyn Hearns, MD Electronically Signed: Charline BillsEssence Howell, ED Scribe 09/21/2016 at 2:24 PM.   Patient ID: Cheryl Wang, female    DOB: 07/02/1950, 66 y.o.   MRN: [id number]  Chief Complaint  Patient presents with  . Back Pain    left side lower with frequent urination no dysuria x1 day  . Travel Consult    travel to [place] in October   HPI Cheryl PerchesLois L Wang is a 66 y.o. female who [facility] complaining of sudden onset of non-radiating left low back pain onset yesterday. Pt reports sudden onset of pain while she was driving home yesterday after playing with Legos on the floor with her grandsons. She states that she was having back pain prior to exercising with her trainer and did gentle stretches which did not exacerbate pain. She has noticed urinary frequency onset yesterday as well. She has tried ice and ibuprofen yesterday. Pt denies fall, injury, dysuria, bladder/bowel incontinence, fever, night sweats, abdominal pain, nausea, vomiting, blood in stools, weakness in lower extremities, saddle anesthesia, numbness. No previous back pain or kidney stones. She reports very rare aspirin use. Multiple episodes of nocturia last night, no hematuria. No fevers, no chills.  Pt plans to return to discuss travel vaccinations for her trip to [place] in October.   Patient Active Problem List   Diagnosis Date Noted  . S/P total knee arthroplasty 05/13/2014  . Hx of migraines 11/24/2012  . Left knee DJD 11/24/2012  . Right knee DJD 11/24/2012  . Obesity, unspecified 11/24/2012  . Spondylosis, cervical 11/24/2012   Past Medical History:  Diagnosis Date  . Arthritis    in hands  . Asthma    as a child  . Chicken pox   . Eczema   . Hives   . Medical history non-contributory   . Spondylosis 11/24/2012   per xray  . Wears glasses     Past Surgical History:  Procedure Laterality Date  . ABDOMINAL HYSTERECTOMY  2002   VAG HYST-  . CARPAL TUNNEL RELEASE Left 08/30/2012   Procedure: CARPAL TUNNEL RELEASE;  Surgeon: Nicki ReaperGary R Kuzma, MD;  Location: [facility];  Service: Orthopedics;  Laterality: Left;  . COLONOSCOPY    . cyst removal  10/1988   on chest   . KNEE ARTHROSCOPY  1995   RIGHT AND LEFT  . KNEE SURGERY     Bil  . TONSILLECTOMY    . TOTAL KNEE ARTHROPLASTY Left 05/13/2014   Procedure: LEFT TOTAL KNEE ARTHROPLASTY;  Surgeon: Dannielle HuhSteve Lucey, MD;  Location: [facility];  Service: Orthopedics;  Laterality: Left;  . TRIGGER FINGER RELEASE Left 08/30/2012   Procedure: STS RELEASE LEFT THUMB;  Surgeon: Nicki ReaperGary R Kuzma, MD;  Location: [facility];  Service: Orthopedics;  Laterality: Left;   Allergies  Allergen Reactions  . Penicillins      Did not work effectively when she had a skin graft in her mouth.   Prior to Admission medications   Medication Sig Start Date End Date Taking? Authorizing Provider  acetaminophen (TYLENOL) 500 MG tablet Take 1,000 mg by mouth every 6 (six) hours as needed.    [provider]  calcium carbonate 1250 MG capsule Take 1,250 mg by mouth 2 (two) times daily with a meal.    [provider]  cholecalciferol (  VITAMIN D) 1000 units tablet Take 1,000 Units by mouth daily.    [provider]  doxylamine, Sleep, (UNISOM) 25 MG tablet Take 50 mg by mouth at bedtime as needed.     [provider]  ELDERBERRY PO Take by mouth.    [provider]  Ginkgo Biloba 100 MG CAPS Take by mouth.    [provider]  ibuprofen (ADVIL,MOTRIN) 200 MG tablet Take 200 mg by mouth 2 (two) times daily.    [provider]  Multiple Vitamins-Minerals (MULTIVITAMIN WITH MINERALS) tablet Take 1 tablet by mouth daily.    [provider]  triamcinolone cream (KENALOG) 0.1 % Apply 1 application topically 2 (two) times daily. 11/01/15    Cheryl Flood, MD   Social History   Social History  . Marital status: Married    Spouse name: N/A  . Number of children: N/A  . Years of education: N/A   Occupational History  . Retired.    Social History Main Topics  . Smoking status: Former Smoker    Types: Cigarettes    Quit date: 08/26/1978  . Smokeless tobacco: Never Used  . Alcohol use No     Comment: OCC  . Drug use: No  . Sexual activity: Yes   Other Topics Concern  . Not on file   Social History Narrative   Married   College   Does not work   Research officer, political party for exercise 2x's weekly.   Review of Systems  Constitutional: Negative for diaphoresis and fever.  Gastrointestinal: Negative for abdominal pain, blood in stool, nausea and vomiting.  Genitourinary: Positive for frequency. Negative for dysuria and enuresis.  Musculoskeletal: Positive for back pain.  Neurological: Negative for weakness and numbness.      Objective:   Physical Exam  Constitutional: She is oriented to person, place, and time. She appears well-developed and well-nourished. No distress.  HENT:  Head: Normocephalic and atraumatic.  Eyes: Conjunctivae and EOM are normal.  Neck: Neck supple. No tracheal deviation present.  Cardiovascular: Normal rate.   Pulmonary/Chest: Effort normal. No respiratory distress.  Abdominal: Soft. There is no tenderness. There is no CVA tenderness, no tenderness at McBurney's point and negative Murphy's sign.  Musculoskeletal: Normal range of motion.  Skin is intact. No rash. Tenderness along the L lower paraspinals towards the SI joint. Able to heel and toe walk without difficulty. Flexion intact. Discomfort with extension. Pain to the L with R lateral flexion. L lateral flexion is okay. Rotation intact. Negative seated straight leg raise.   Neurological: She is alert and oriented to person, place, and time. She displays no Babinski's sign on the right side. She displays no Babinski's sign on the left side.  Reflex  Scores:      Patellar reflexes are 2+ on the right side and 2+ on the left side.      Achilles reflexes are 2+ on the right side and 2+ on the left side. Skin: Skin is warm and dry.  Psychiatric: She has a normal mood and affect. Her behavior is normal.  Nursing note and vitals reviewed.  Vitals:   09/21/16 1349  BP: 123/70  Pulse: 81  Resp: 16  Temp: 98.2 F (36.8 C)  TempSrc: Oral  SpO2: 100%  Weight: 202 lb (91.6 kg)  Height: 5\' 3"  (1.6 m)   Results for orders placed or performed in visit on 09/21/16  POCT Urinalysis Dipstick (Automated)  Result Value Ref Range   Color, UA yellow  Clarity, UA clear    Glucose, UA negative    Bilirubin, UA negative    Ketones, UA negative    Spec Grav, UA 1.020 1.010 - 1.025   Blood, UA tracei-intact    pH, UA 5.0 5.0 - 8.0   Protein, UA negative    Urobilinogen, UA 0.2 0.2 or 1.0 E.U./dL   Nitrite, UA negative    Leukocytes, UA Negative Negative  POCT Microscopic Urinalysis ([facility])  Result Value Ref Range   WBC,UR,HPF,POC Few (A) None WBC/hpf   RBC,UR,HPF,POC None None RBC/hpf   Bacteria None None, Too numerous to count   Mucus Absent Absent   Epithelial Cells, UR Per Microscopy None None, Too numerous to count cells/hpf      Assessment & Plan:    HAGAN MALTZ is a 66 y.o. female Urinary frequency - Plan: POCT Urinalysis Dipstick (Automated), POCT Microscopic Urinalysis ([facility]), nitrofurantoin, macrocrystal-monohydrate, (MACROBID) 100 MG capsule, Urine Culture  - Check urine culture, start Macrobid 100 mg twice a day for possible early UTI based on significant symptoms. RTC precautions if worsening. Doubt pyelonephritis given no true CVA tenderness, afebrile, no nausea vomiting or abdominal pain.  Acute left-sided low back pain without sciatica - Plan: cyclobenzaprine (FLEXERIL) 5 MG tablet, meloxicam (MOBIC) 7.5 MG tablet  - Appears to be muscular, low back strain after activity with grandkids on floor versus spasm versus HNP.  Doubt infectious. Does not appear to be bilateral. Reproduced on exam with left paraspinals.   -Flexeril 5-10 mg 3 times a day when necessary. Mobic 7.5 mg daily when necessary. Recheck in 48 hours if not improving, sooner if worse.  - Imaging deferred today given no red flag symptoms. Rtc/er precautions if worsening.    My chart message sent with linked to CDC.gov website on travel medicine. Depending on her location of travel, can discuss immunizations, or preventative treatments at her follow-up visit.  Meds ordered this encounter  Medications  . cyclobenzaprine (FLEXERIL) 5 MG tablet    Sig: Take 1-2 tablets (5-10 mg total) by mouth 3 (three) times daily as needed.    Dispense:  20 tablet    Refill:  0  . meloxicam (MOBIC) 7.5 MG tablet    Sig: Take 1 tablet (7.5 mg total) by mouth daily.    Dispense:  15 tablet    Refill:  0  . nitrofurantoin, macrocrystal-monohydrate, (MACROBID) 100 MG capsule    Sig: Take 1 capsule (100 mg total) by mouth 2 (two) times daily.    Dispense:  14 capsule    Refill:  0   Patient Instructions   Back pain appears to be muscular at this time. Try meloxicam once per day, Flexeril 1-2 pills up to 3 times per day. Do not drive or operate machinery while taking Flexeril. Recheck in the next 2 days if not showing some signs of improvement, or sooner if worsening including fevers, abdominal pain, nausea, vomiting.  I will check a urine culture, but you can start the antibiotic for now for possible urinary tract infection with the urinary symptoms. In office testing was overall reassuring.  Return to the clinic or go to the nearest emergency room if any of your symptoms worsen or new symptoms occur.   Back Pain, Adult Back pain is very common in adults.The cause of back pain is rarely dangerous and the pain often gets better over time.The cause of your back pain may not be known. Some common causes of back pain include:  Strain of  the muscles or ligaments  supporting the spine.  Wear and tear (degeneration) of the spinal disks.  Arthritis.  Direct injury to the back.  For many people, back pain may return. Since back pain is rarely dangerous, most people can learn to manage this condition on their own. Follow these instructions at home: Watch your back pain for any changes. The following actions may help to lessen any discomfort you are feeling:  Remain active. It is stressful on your back to sit or stand in one place for long periods of time. Do not sit, drive, or stand in one place for more than 30 minutes at a time. Take short walks on even surfaces as soon as you are able.Try to increase the length of time you walk each day.  Exercise regularly as directed by your health care provider. Exercise helps your back heal faster. It also helps avoid future injury by keeping your muscles strong and flexible.  Do not stay in bed.Resting more than 1-2 days can delay your recovery.  Pay attention to your body when you bend and lift. The most comfortable positions are those that put less stress on your recovering back. Always use proper lifting techniques, including: ? Bending your knees. ? Keeping the load close to your body. ? Avoiding twisting.  Find a comfortable position to sleep. Use a firm mattress and lie on your side with your knees slightly bent. If you lie on your back, put a pillow under your knees.  Avoid feeling anxious or stressed.Stress increases muscle tension and can worsen back pain.It is important to recognize when you are anxious or stressed and learn ways to manage it, such as with exercise.  Take medicines only as directed by your health care provider. Over-the-counter medicines to reduce pain and inflammation are often the most helpful.Your health care provider may prescribe muscle relaxant drugs.These medicines help dull your pain so you can more quickly return to your normal activities and healthy exercise.  Apply  ice to the injured area: ? Put ice in a plastic bag. ? Place a towel between your skin and the bag. ? Leave the ice on for 20 minutes, 2-3 times a day for the first 2-3 days. After that, ice and heat may be alternated to reduce pain and spasms.  Maintain a healthy weight. Excess weight puts extra stress on your back and makes it difficult to maintain good posture.  Contact a health care provider if:  You have pain that is not relieved with rest or medicine.  You have increasing pain going down into the legs or buttocks.  You have pain that does not improve in one week.  You have night pain.  You lose weight.  You have a fever or chills. Get help right away if:  You develop new bowel or bladder control problems.  You have unusual weakness or numbness in your arms or legs.  You develop nausea or vomiting.  You develop abdominal pain.  You feel faint. This information is not intended to replace advice given to you by your health care provider. Make sure you discuss any questions you have with your health care provider. Document Released: 02/15/2005 Document Revised: 06/26/2015 Document Reviewed: 06/19/2013 Elsevier Interactive Patient Education  2017 [employer].     IF you received an x-ray today, you will receive an invoice from [facility]. Please contact [facility] at [phone] with questions or concerns regarding your invoice.   IF you received labwork today, you will  receive an invoice from [facility]. Please contact [facility] at [phone] with questions or concerns regarding your invoice.   Our billing staff will not be able to assist you with questions regarding bills from these companies.  You will be contacted with the lab results as soon as they are available. The fastest way to get your results is to activate your My Chart account. Instructions are located on the last page of this paperwork. If you have not heard from [place] regarding the  results in 2 weeks, please contact this office.       I personally performed the services described in this documentation, which was scribed in my presence. The recorded information has been reviewed and considered for accuracy and completeness, addended by me as needed, and agree with information above.  Signed,   Cheryl Staggers, MD Primary Care at [facility].  09/21/16 3:18 PM

## 2016-09-22 LAB — URINE CULTURE: ORGANISM ID, BACTERIA: NO GROWTH

## 2016-09-24 ENCOUNTER — Ambulatory Visit: Admitting: Family Medicine

## 2016-12-02 ENCOUNTER — Ambulatory Visit (INDEPENDENT_AMBULATORY_CARE_PROVIDER_SITE_OTHER): Admitting: Family Medicine

## 2016-12-02 ENCOUNTER — Encounter: Payer: Self-pay | Admitting: Family Medicine

## 2016-12-02 VITALS — BP 126/78 | HR 88 | Temp 98.1°F | Resp 16 | Ht 63.0 in | Wt 195.6 lb

## 2016-12-02 DIAGNOSIS — Z7184 Encounter for health counseling related to travel: Secondary | ICD-10-CM

## 2016-12-02 DIAGNOSIS — Z23 Encounter for immunization: Secondary | ICD-10-CM

## 2016-12-02 DIAGNOSIS — Z7189 Other specified counseling: Secondary | ICD-10-CM | POA: Diagnosis not present

## 2016-12-02 MED ORDER — TYPHOID VACCINE PO CPDR: 1.0000 | DELAYED_RELEASE_CAPSULE | ORAL | 0 refills | Status: DC

## 2016-12-02 MED ORDER — CIPROFLOXACIN HCL 500 MG PO TABS
500.0000 mg | ORAL_TABLET | Freq: Two times a day (BID) | ORAL | 0 refills | Status: DC
Start: 2016-12-02 — End: 2017-04-05

## 2016-12-02 NOTE — Patient Instructions (Addendum)
If you need malaria prophylaxis, let me know as I can write doxycycline.   For yellow fever - start Vivotif, every other day for 4 doses.   1st dose of Hep A vaccine today - repeat in 6 months.   Cipro twice per day for 3 days if you do have signs or symptoms of traveler's diarrhea. See information on handout.  Have a safe trip!    IF you received an x-ray today, you will receive an invoice from [facility]. Please contact [facility] at [phone] with questions or concerns regarding your invoice.   IF you received labwork today, you will receive an invoice from [facility]. Please contact [facility] at [phone] with questions or concerns regarding your invoice.   Our billing staff will not be able to assist you with questions regarding bills from these companies.  You will be contacted with the lab results as soon as they are available. The fastest way to get your results is to activate your My Chart account. Instructions are located on the last page of this paperwork. If you have not heard from [place] regarding the results in 2 weeks, please contact this office.

## 2016-12-02 NOTE — Progress Notes (Signed)
Subjective:  By signing my name below, I, Stann Ore, attest that this documentation has been prepared under the direction and in the presence of Meredith Staggers, MD. Electronically Signed: Stann Ore, Scribe. 12/02/2016 , 4:09 PM .  Patient was seen in Room 11 .   Patient ID: Cheryl Wang, female    DOB: April 04, 1950, 66 y.o.   MRN: [id number] Chief Complaint  Patient presents with  . Travel Consult    needs medication to prevent diarrhea   HPI Cheryl Wang is a 66 y.o. female Here for medication for travel. She plans to travel to [place] on Nov 1st until Nov 9th. She requests medication for traveler's diarrhea, as well as prevention against typhoid. She denies immune disease, or anyone with immune disease. She also mentions planning to travel to [place] camps in May 2019. She denies fever or diarrhea.   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 She's in [employer] organization.   Immunizations Flu shot: She declines flu shot today.  Tetanus: up to date, received in 2016.  Hep A vaccine: believes she's had it before a long time ago, but didn't finish. Agrees to receive today.   Malaria:  She plans to use permethrin spray on her clothes and her suitcase to prevent bugs and insects, but will double check if malaria prophylaxis is needed.   Patient Active Problem List   Diagnosis Date Noted  . S/P total knee arthroplasty 05/13/2014  . Hx of migraines 11/24/2012  . Left knee DJD 11/24/2012  . Right knee DJD 11/24/2012  . Obesity, unspecified 11/24/2012  . Spondylosis, cervical 11/24/2012   Past Medical History:  Diagnosis Date  . Arthritis    in hands  . Asthma    as a child  . Chicken pox   . Eczema   . Hives   . Medical history non-contributory   . Spondylosis 11/24/2012   per xray  . Wears glasses    Past Surgical History:  Procedure Laterality Date  . ABDOMINAL HYSTERECTOMY  2002   VAG HYST-  . CARPAL TUNNEL RELEASE  Left 08/30/2012   Procedure: CARPAL TUNNEL RELEASE;  Surgeon: Nicki Reaper, MD;  Location: [facility];  Service: Orthopedics;  Laterality: Left;  . COLONOSCOPY    . cyst removal  10/1988   on chest   . KNEE ARTHROSCOPY  1995   RIGHT AND LEFT  . KNEE SURGERY     Bil  . TONSILLECTOMY    . TOTAL KNEE ARTHROPLASTY Left 05/13/2014   Procedure: LEFT TOTAL KNEE ARTHROPLASTY;  Surgeon: Dannielle Huh, MD;  Location: [facility];  Service: Orthopedics;  Laterality: Left;  . TRIGGER FINGER RELEASE Left 08/30/2012   Procedure: STS RELEASE LEFT THUMB;  Surgeon: Nicki Reaper, MD;  Location: [facility];  Service: Orthopedics;  Laterality: Left;   Allergies  Allergen Reactions  . Penicillins      Did not work effectively when she had a skin graft in her mouth.   Prior to Admission medications   Medication Sig Start Date End Date Taking? Authorizing Provider  acetaminophen (TYLENOL) 500 MG tablet Take 1,000 mg by mouth every 6 (six) hours as needed.    [provider]  calcium carbonate 1250 MG capsule Take 1,250 mg by mouth 2 (two) times daily with a meal.    [provider]  cholecalciferol (VITAMIN D) 1000 units tablet Take 1,000 Units by mouth  daily.    [provider]  cyclobenzaprine (FLEXERIL) 5 MG tablet Take 1-2 tablets (5-10 mg total) by mouth 3 (three) times daily as needed. 09/21/16   Shade Flood, MD  doxylamine, Sleep, (UNISOM) 25 MG tablet Take by mouth at bedtime as needed.     [provider]  ELDERBERRY PO Take by mouth.    [provider]  Ginkgo Biloba 100 MG CAPS Take by mouth.    [provider]  ibuprofen (ADVIL,MOTRIN) 200 MG tablet Take 200 mg by mouth 2 (two) times daily.    [provider]  meloxicam (MOBIC) 7.5 MG tablet Take 1 tablet (7.5 mg total) by mouth daily. 09/21/16   Shade Flood, MD  Multiple Vitamins-Minerals (MULTIVITAMIN WITH MINERALS) tablet Take 1 tablet by mouth daily.     [provider]  nitrofurantoin, macrocrystal-monohydrate, (MACROBID) 100 MG capsule Take 1 capsule (100 mg total) by mouth 2 (two) times daily. 09/21/16   Shade Flood, MD  triamcinolone cream (KENALOG) 0.1 % Apply 1 application topically 2 (two) times daily. 11/01/15   Shade Flood, MD   Social History   Social History  . Marital status: Married    Spouse name: N/A  . Number of children: N/A  . Years of education: N/A   Occupational History  . Retired.    Social History Main Topics  . Smoking status: Former Smoker    Types: Cigarettes    Quit date: 08/26/1978  . Smokeless tobacco: Never Used  . Alcohol use No     Comment: OCC  . Drug use: No  . Sexual activity: Yes   Other Topics Concern  . Not on file   Social History Narrative   Married   College   Does not work   Research officer, political party for exercise 2x's weekly.   Review of Systems  Constitutional: Negative for chills, fatigue, fever and unexpected weight change.  Respiratory: Negative for cough.   Gastrointestinal: Negative for constipation, diarrhea, nausea and vomiting.  Skin: Negative for rash and wound.  Neurological: Negative for dizziness, weakness and headaches.       Objective:   Physical Exam  Constitutional: She is oriented to person, place, and time. She appears well-developed and well-nourished. No distress.  HENT:  Head: Normocephalic and atraumatic.  Eyes: Pupils are equal, round, and reactive to light. EOM are normal.  Neck: Neck supple.  Cardiovascular: Normal rate.   Pulmonary/Chest: Effort normal. No respiratory distress.  Musculoskeletal: Normal range of motion.  Neurological: She is alert and oriented to person, place, and time.  Skin: Skin is warm and dry.  Psychiatric: She has a normal mood and affect. Her behavior is normal.  Nursing note and vitals reviewed.   Vitals:   12/02/16 1529  BP: 126/78  Pulse: 88  Resp: 16  Temp: 98.1 F (36.7 C)  SpO2: 98%  Weight: 195 lb 9.6 oz  (88.7 kg)  Height:  (1.6 m)      Assessment & Plan:   Cheryl Wang is a 66 y.o. female Counseling about travel  - consult CDC for specific recommendations based on location of travel.   Need for prophylactic vaccination and inoculation against viral hepatitis  - Hep A vaccine given - 1st dose, discussed incomplete protection with initial dose, repeat in 6 months.   - food precautions discussed  Need for immunization against typhoid  - Vivotif Rx given.   Meds ordered this encounter  Medications  . typhoid (VIVOTIF)  DR capsule    Sig: Take 1 capsule by mouth every other day.    Dispense:  4 capsule    Refill:  0  . ciprofloxacin (CIPRO) 500 MG tablet    Sig: Take 1 tablet (500 mg total) by mouth 2 (two) times daily.    Dispense:  6 tablet    Refill:  0   Patient Instructions   If you need malaria prophylaxis, let me know as I can write doxycycline.   For yellow fever - start Vivotif, every other day for 4 doses.   1st dose of Hep A vaccine today - repeat in 6 months.   Cipro twice per day for 3 days if you do have signs or symptoms of traveler's diarrhea. See information on handout.  Have a safe trip!    IF you received an x-ray today, you will receive an invoice from [facility]. Please contact [facility] at [phone] with questions or concerns regarding your invoice.   IF you received labwork today, you will receive an invoice from [facility]. Please contact [facility] at [phone] with questions or concerns regarding your invoice.   Our billing staff will not be able to assist you with questions regarding bills from these companies.  You will be contacted with the lab results as soon as they are available. The fastest way to get your results is to activate your My Chart account. Instructions are located on the last page of this paperwork. If you have not heard from [place] regarding the results in 2 weeks, please contact this office.        I personally performed the services described in this documentation, which was scribed in my presence. The recorded information has been reviewed and considered for accuracy and completeness, addended by me as needed, and agree with information above.  Signed,   Meredith Staggers, MD Primary Care at [facility].  12/04/16 11:21 PM

## 2016-12-12 IMAGING — CR DG WRIST COMPLETE 3+V*L*
2 series · 2 of 2 positions shown · non-contrast
Comparison: None.

CLINICAL DATA: Left wrist contusion.  Left wrist pain.

EXAM:
LEFT WRIST - COMPLETE 3+ VIEW

[PA]
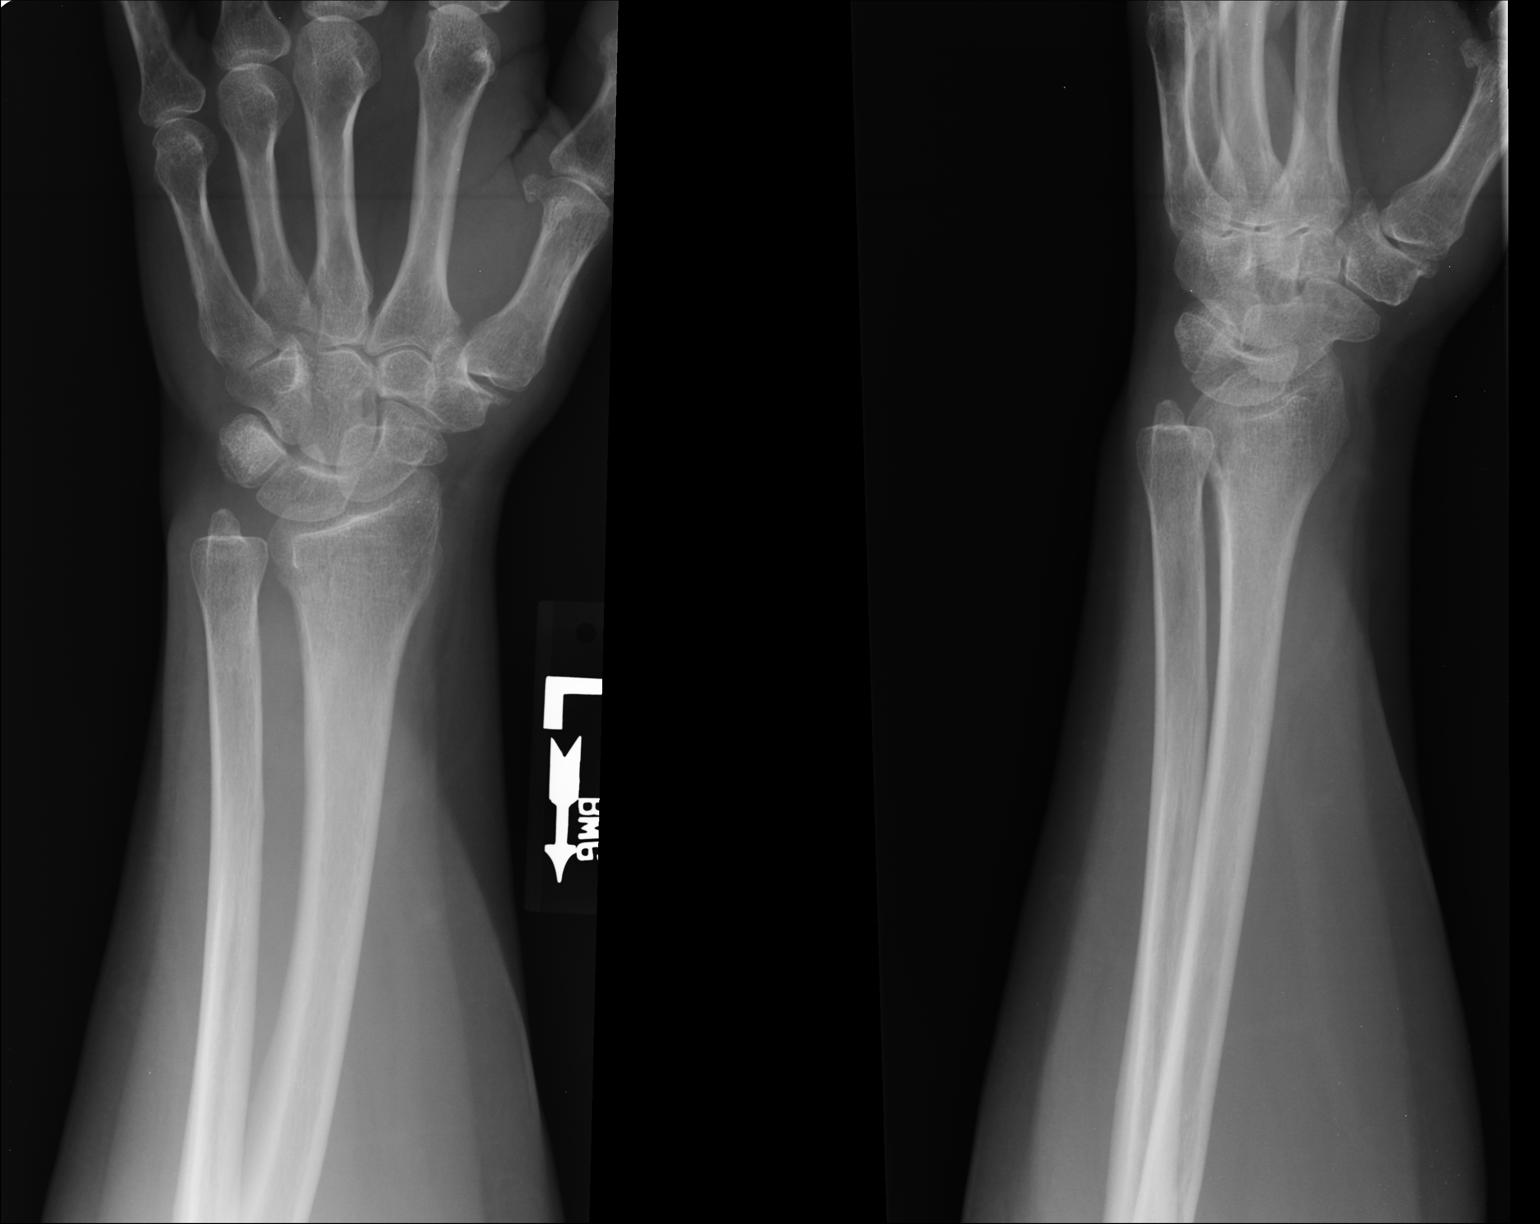

[lateral]
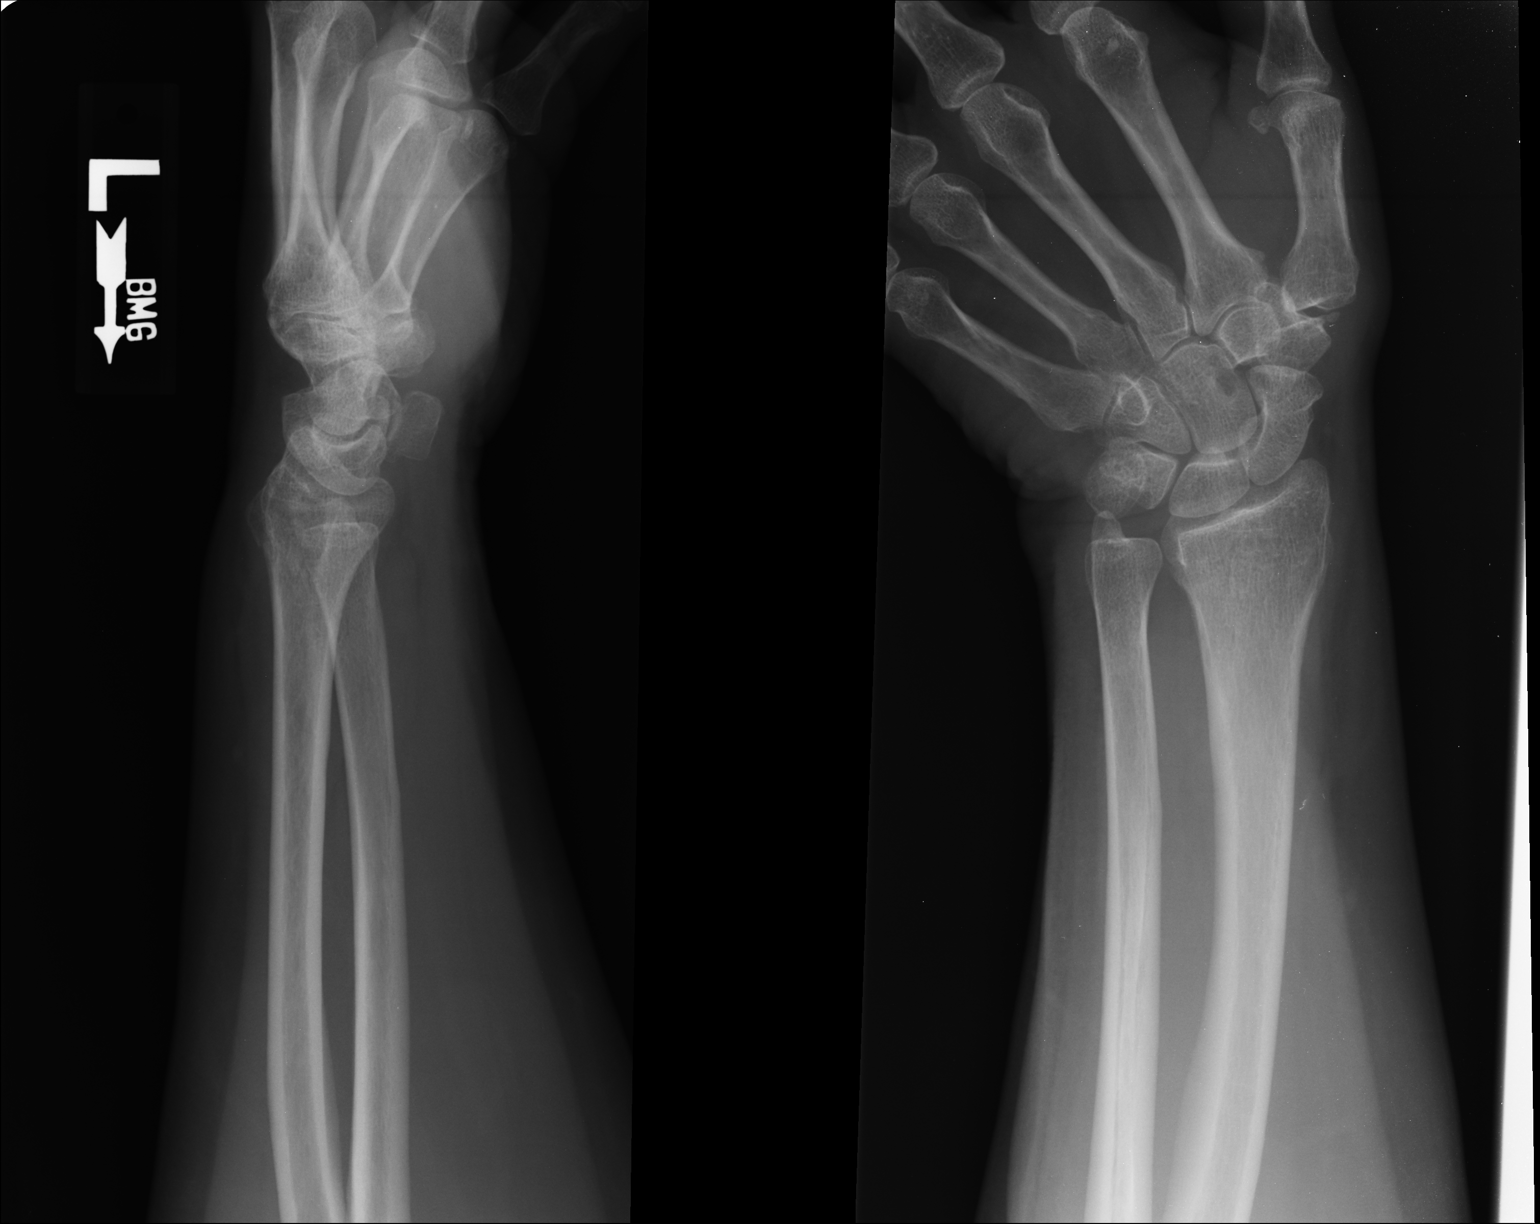

[2 of 2 positions shown; findings below may reference images not displayed]

FINDINGS: There is a subtle nondisplaced fracture through the distal left
radius. No displacement or angulation. No ulnar abnormality.
IMPRESSION: Subtle nondisplaced distal left radial fracture

## 2017-03-16 ENCOUNTER — Encounter: Payer: Self-pay | Admitting: Family Medicine

## 2017-03-18 NOTE — Telephone Encounter (Signed)
Please call radiology to clarfy what repeat study is recommended for T11 abnormality on prior CT.   I'm happy to order that without an office visit for the patient, but wanted make sure I order the right study.   Thanks,  -[employer]

## 2017-04-05 ENCOUNTER — Other Ambulatory Visit: Payer: Self-pay

## 2017-04-05 ENCOUNTER — Encounter: Payer: Self-pay | Admitting: Physician Assistant

## 2017-04-05 ENCOUNTER — Ambulatory Visit (INDEPENDENT_AMBULATORY_CARE_PROVIDER_SITE_OTHER): Admitting: Physician Assistant

## 2017-04-05 VITALS — BP 122/64 | HR 99 | Temp 100.0°F | Resp 18 | Ht 63.0 in | Wt 197.2 lb

## 2017-04-05 DIAGNOSIS — J069 Acute upper respiratory infection, unspecified: Secondary | ICD-10-CM

## 2017-04-05 MED ORDER — HYDROCODONE-HOMATROPINE 5-1.5 MG/5ML PO SYRP: 5.0000 mL | ORAL_SOLUTION | Freq: Four times a day (QID) | ORAL | 0 refills | Status: DC | PRN

## 2017-04-05 MED ORDER — GUAIFENESIN ER 1200 MG PO TB12: 1.0000 | ORAL_TABLET | Freq: Two times a day (BID) | ORAL | 0 refills | Status: AC

## 2017-04-05 NOTE — Patient Instructions (Addendum)
For now - use cough medication and mucinex to help decrease mucus that you already have.  Increase your fluids and try to increase the humidity in your home.  For ear - prior to travel on Sat - Sudafed immediate release (you have to take every 4 hours) take 1 hour prior to 1st flight and then again in 4 hours - as long as your 4 hours will include landing at your destination.  Also get Afrin prior to take off and 30 minutes before landing on your last flight  Motrin for the pain - esp if the ear starts to feel tight  IF you received an x-ray today, you will receive an invoice from [facility]. Please contact [facility] at [phone] with questions or concerns regarding your invoice.   IF you received labwork today, you will receive an invoice from [facility]. Please contact [facility] at [phone] with questions or concerns regarding your invoice.   Our billing staff will not be able to assist you with questions regarding bills from these companies.  You will be contacted with the lab results as soon as they are available. The fastest way to get your results is to activate your My Chart account. Instructions are located on the last page of this paperwork. If you have not heard from us regarding the results in 2 weeks, please contact this office.

## 2017-04-05 NOTE — Progress Notes (Signed)
Cheryl Wang  MRN: [id number] DOB: October 11, 1950  PCP: Shade Flood, MD  Chief Complaint  Patient presents with  . Cough    x 1 week   . Generalized Body Aches    Subjective:  Pt presents to clinic for cold symptoms that started about a week ago.  It started with chest tightness.  She currently has a cough that is keeping her up at night but no wheezing or SOB.  The cough is not productive and seems to be coming from her throat and is triggered anytime air crosses the area.  She is worried about her ear because she is flying in 4 days and the last time she flew after a cold her  Ear hurt really bad after the plane descent and she wants to make sure that is not going to happen again.  She has used cough medications over the counter last night but it did not work that well.  She typically likes to let these viruses take it course.    History is obtained by patient.  Review of Systems  Constitutional: Positive for chills and fever (T max 100).  HENT: Negative for postnasal drip, rhinorrhea and sore throat.   Respiratory: Positive for cough (no mucus). Negative for shortness of breath and wheezing.        Childhood asthma.  Non-smoker  Neurological: Positive for headaches.  Psychiatric/Behavioral: Positive for sleep disturbance (2nd to cough).    Patient Active Problem List   Diagnosis Date Noted  . S/P total knee arthroplasty 05/13/2014  . Hx of migraines 11/24/2012  . Left knee DJD 11/24/2012  . Right knee DJD 11/24/2012  . Obesity, unspecified 11/24/2012  . Spondylosis, cervical 11/24/2012    Current Outpatient Medications on File Prior to Visit  Medication Sig Dispense Refill  . acetaminophen (TYLENOL) 500 MG tablet Take 1,000 mg by mouth every 6 (six) hours as needed.    . calcium carbonate 1250 MG capsule Take 1,250 mg by mouth 2 (two) times daily with a meal.    . cholecalciferol (VITAMIN D) 1000 units tablet Take 1,000 Units by mouth daily.    Marland Kitchen doxylamine, Sleep,  (UNISOM) 25 MG tablet Take 50 mg by mouth at bedtime as needed.     Marland Kitchen ELDERBERRY PO Take by mouth.    . Ginkgo Biloba 100 MG CAPS Take by mouth.    Marland Kitchen ibuprofen (ADVIL,MOTRIN) 200 MG tablet Take 200 mg by mouth 2 (two) times daily.    . Multiple Vitamins-Minerals (MULTIVITAMIN WITH MINERALS) tablet Take 1 tablet by mouth daily.    Marland Kitchen triamcinolone cream (KENALOG) 0.1 % Apply 1 application topically 2 (two) times daily. 30 g 0   No current facility-administered medications on file prior to visit.     Allergies  Allergen Reactions  . Penicillins      Did not work effectively when she had a skin graft in her mouth.    Past Medical History:  Diagnosis Date  . Arthritis    in hands  . Asthma    as a child  . Chicken pox   . Eczema   . Hives   . Medical history non-contributory   . Spondylosis 11/24/2012   per xray  . 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   Social History   Tobacco Use  . Smoking status: Former  Smoker    Types: Cigarettes    Last attempt to quit: 08/26/1978    Years since quitting: 38.6  . Smokeless tobacco: Never Used  Substance Use Topics  . Alcohol use: No    Comment: OCC  . Drug use: No   family history includes Anorectal malformation in her son; Heart disease in her brother; Hyperlipidemia in her father.     Objective:  BP 122/64   Pulse 99   Temp 100 F (37.8 C) (Oral)   Resp 18   Ht 5\' 3"  (1.6 m)   Wt 197 lb 3.2 oz (89.4 kg)   SpO2 98%   BMI 34.93 kg/m  Body mass index is 34.93 kg/m.  Physical Exam  Constitutional: She is oriented to person, place, and time and well-developed, well-nourished, and in no distress.  HENT:  Head: Normocephalic and atraumatic.  Right Ear: Hearing, tympanic membrane, external ear and ear canal normal.  Left Ear: Hearing, external ear and ear canal normal. A middle ear effusion (serous fluid - no erythema) is  present.  Nose: Nose normal.  Mouth/Throat: Uvula is midline, oropharynx is clear and moist and mucous membranes are normal.  Eyes: Conjunctivae are normal.  Neck: Normal range of motion.  Cardiovascular: Normal rate, regular rhythm and normal heart sounds.  No murmur heard. Pulmonary/Chest: Effort normal and breath sounds normal. She has no wheezes. She has no rales.  Neurological: She is alert and oriented to person, place, and time. Gait normal.  Skin: Skin is warm and dry.  Psychiatric: Mood, memory, affect and judgment normal.  Vitals reviewed.   Assessment and Plan :  URI with cough and congestion - Plan: Guaifenesin (MUCINEX MAXIMUM STRENGTH) 1200 MG TB12, HYDROcodone-homatropine (HYCODAN) 5-1.5 MG/5ML syrup - symptomatic control d/w pt.  Also we discussed what she can use to prevent increase in ear pressure with upcoming plane ride.  Benny LennertSarah Ercole Georg PA-C  Primary Care at [facility] 04/05/2017 3:14 PM

## 2017-07-13 ENCOUNTER — Encounter: Payer: Self-pay | Admitting: Family Medicine

## 2017-09-06 DIAGNOSIS — Z124 Encounter for screening for malignant neoplasm of cervix: Secondary | ICD-10-CM | POA: Diagnosis not present

## 2017-09-06 DIAGNOSIS — Z6835 Body mass index (BMI) 35.0-35.9, adult: Secondary | ICD-10-CM | POA: Diagnosis not present

## 2017-09-06 DIAGNOSIS — M8588 Other specified disorders of bone density and structure, other site: Secondary | ICD-10-CM | POA: Diagnosis not present

## 2017-09-06 DIAGNOSIS — N958 Other specified menopausal and perimenopausal disorders: Secondary | ICD-10-CM | POA: Diagnosis not present

## 2017-09-06 DIAGNOSIS — Z1231 Encounter for screening mammogram for malignant neoplasm of breast: Secondary | ICD-10-CM | POA: Diagnosis not present

## 2017-09-08 ENCOUNTER — Other Ambulatory Visit: Payer: Self-pay | Admitting: Obstetrics and Gynecology

## 2017-09-08 DIAGNOSIS — R928 Other abnormal and inconclusive findings on diagnostic imaging of breast: Secondary | ICD-10-CM

## 2017-09-15 ENCOUNTER — Ambulatory Visit
Admission: RE | Admit: 2017-09-15 | Discharge: 2017-09-15 | Disposition: A | Discharge status: Home / Self Care | Payer: Medicare Other | Source: Ambulatory Visit | Attending: Obstetrics and Gynecology | Admitting: Obstetrics and Gynecology

## 2017-09-15 DIAGNOSIS — N6001 Solitary cyst of right breast: Secondary | ICD-10-CM | POA: Diagnosis not present

## 2017-09-15 DIAGNOSIS — R928 Other abnormal and inconclusive findings on diagnostic imaging of breast: Secondary | ICD-10-CM

## 2017-11-22 ENCOUNTER — Telehealth: Payer: Self-pay | Admitting: Family Medicine

## 2017-11-22 DIAGNOSIS — Z87898 Personal history of other specified conditions: Secondary | ICD-10-CM

## 2017-11-22 NOTE — Telephone Encounter (Signed)
Copied from CRM [phone]. Topic: Quick Communication - Rx Refill/Question >> Nov 22, 2017 12:24 PM Crist InfanteHarrald, Kathy J wrote: Medication: Scopolamine patch  Pt is going on a cruise in a week and would like this Rx for sea sickness Pt has cpe scheduled for november

## 2017-11-23 MED ORDER — SCOPOLAMINE 1 MG/3DAYS TD PT72: 1.0000 | MEDICATED_PATCH | TRANSDERMAL | 0 refills | Status: AC

## 2017-11-23 NOTE — Addendum Note (Signed)
Addended by: Meredith Staggers R on: 11/23/2017 11:18 AM   Modules accepted: Orders

## 2017-11-23 NOTE — Telephone Encounter (Signed)
rx provided. Use new patch every 3 days. Keep follow up appt as scheduled.

## 2018-01-03 ENCOUNTER — Encounter: Payer: Self-pay | Admitting: Family Medicine

## 2018-01-03 ENCOUNTER — Other Ambulatory Visit: Payer: Self-pay

## 2018-01-03 ENCOUNTER — Ambulatory Visit (INDEPENDENT_AMBULATORY_CARE_PROVIDER_SITE_OTHER): Payer: Medicare Other | Admitting: Family Medicine

## 2018-01-03 VITALS — BP 134/80 | HR 74 | Temp 97.9°F | Ht 63.0 in | Wt 200.8 lb

## 2018-01-03 DIAGNOSIS — R937 Abnormal findings on diagnostic imaging of other parts of musculoskeletal system: Secondary | ICD-10-CM

## 2018-01-03 DIAGNOSIS — E785 Hyperlipidemia, unspecified: Secondary | ICD-10-CM | POA: Diagnosis not present

## 2018-01-03 DIAGNOSIS — Z0000 Encounter for general adult medical examination without abnormal findings: Secondary | ICD-10-CM | POA: Diagnosis not present

## 2018-01-03 DIAGNOSIS — Z8249 Family history of ischemic heart disease and other diseases of the circulatory system: Secondary | ICD-10-CM

## 2018-01-03 DIAGNOSIS — Z136 Encounter for screening for cardiovascular disorders: Secondary | ICD-10-CM

## 2018-01-03 LAB — COMPREHENSIVE METABOLIC PANEL
ALK PHOS: 64 IU/L (ref 39–117)
ALT: 13 IU/L (ref 0–32)
AST: 16 IU/L (ref 0–40)
Albumin/Globulin Ratio: 2 (ref 1.2–2.2)
Albumin: 4.3 g/dL (ref 3.6–4.8)
BILIRUBIN TOTAL: 0.3 mg/dL (ref 0.0–1.2)
BUN / CREAT RATIO: 24 (ref 12–28)
BUN: 19 mg/dL (ref 8–27)
CHLORIDE: 100 mmol/L (ref 96–106)
CO2: 22 mmol/L (ref 20–29)
CREATININE: 0.79 mg/dL (ref 0.57–1.00)
Calcium: 9.6 mg/dL (ref 8.7–10.3)
GFR calc Af Amer: 91 mL/min/{1.73_m2} (ref >59)
GFR calc non Af Amer: 79 mL/min/{1.73_m2} (ref >59)
GLUCOSE: 96 mg/dL (ref 65–99)
Globulin, Total: 2.2 g/dL (ref 1.5–4.5)
Potassium: 4.7 mmol/L (ref 3.5–5.2)
Sodium: 138 mmol/L (ref 134–144)
Total Protein: 6.5 g/dL (ref 6.0–8.5)

## 2018-01-03 LAB — LIPID PANEL
CHOLESTEROL TOTAL: 195 mg/dL (ref 100–199)
Chol/HDL Ratio: 3.4 ratio (ref 0.0–4.4)
HDL: 57 mg/dL (ref >39)
LDL CALC: 119 mg/dL — AB (ref 0–99)
TRIGLYCERIDES: 96 mg/dL (ref 0–149)
VLDL CHOLESTEROL CAL: 19 mg/dL (ref 5–40)

## 2018-01-03 NOTE — Progress Notes (Signed)
Subjective:  By signing my name below, I, Essence Howell, attest that this documentation has been prepared under the direction and in the presence of Wendie Agreste, MD Electronically Signed: Ladene Artist, ED Scribe 01/03/2018 at 9:29 AM.   Patient ID: Cheryl Wang, female    DOB: [birth year], 67 y.o.   MRN: [id number]  Chief Complaint  Patient presents with  . Annual Exam    CPE   HPI Cheryl Wang is a 67 y.o. female who presents to Primary Care at [facility] for a welcome to medicare physical.  CA Screening Colonoscopy: 08/02/13, normal. Rpt 10 yrs Breast CA Screening: 09/15/17 with [place] indicating benign cyst, rpt in 1 yr Cervical CA Screening: OBGYN: Dr. Radene Knee with [facility], reportedly pap 09/06/17. Pt reports ongoing dyspareunia; she has tried topical estrogens recommended by Dr. Radene Knee. Bone Density Screening: 09/06/17, reported normal  Immunizations Immunization History  Administered Date(s) Administered  . Hepatitis A, Adult 12/02/2016  . Influenza Split 12/23/2016  . Tdap 02/11/2015  Due for first pneumonia vaccine today - Pt states that she had an abnormal CT chest and was supposed to return for rpt imaging. She expresses that she wants to defer vaccine until she has rpt imaging. Flu: declines Shingles: received  Fall Screening No falls within the past yr.  Depression Screening Depression screen [facility] 2/9 01/03/2018 04/05/2017 12/02/2016 09/21/2016 06/24/2016  Decreased Interest 0 0 0 0 0  Down, Depressed, Hopeless 0 0 0 0 0  PHQ - 2 Score 0 0 0 0 0   Functional Status Survey: Is the patient deaf or have difficulty hearing?: No Does the patient have difficulty seeing, even when wearing glasses/contacts?: No Does the patient have difficulty concentrating, remembering, or making decisions?: No Does the patient have difficulty walking or climbing stairs?: Yes(right knee stiffness) Does the patient have difficulty dressing or bathing?: No Does the patient have  difficulty doing errands alone such as visiting a doctor's office or shopping?: No H/o R Knee Stiffness. Noted DJD of bilat knees prev. - H/o L knee replacement by Dr. Ronnie Derby. States she is unable to ride a bike now due to inability to bend her R knee now.  Mental Status Screening 6CIT Screen 01/03/2018  What Year? 0 points  What month? 0 points  What time? 0 points  Count back from 20 0 points  Months in reverse 0 points  Repeat phrase 0 points  Total Score 0     Visual Acuity Screening   Right eye Left eye Both eyes  Without correction:     With correction: 20/25 20/30 20/20-1   Vision: wears glasses; last seen a few months ago at [facility] Dentist: has not been seen recently due to change in insurance Exercise: 3 days/wk, walks and has a trainer she meets with for 2 45 minute sessions/wk. Denies cp, sob on exertion.  Family Hx Older brother with h/o heart disease requiring bypass at 65 y.o. (healthy; exercising daily). No family h/o DM or CA.  CT Chest Pt had a CT chest 07/02/16. H/o tobacco use prev. No suspicious pulm nodules. Indeterminate focus within T11, thought to be benign. Consider f/u imaging within 6 months.  Advanced Directives Has a HCPOA and living will. Copies requested.  Patient Active Problem List   Diagnosis Date Noted  . S/P total knee arthroplasty 05/13/2014  . Hx of migraines 11/24/2012  . Left knee DJD 11/24/2012  . Right knee DJD 11/24/2012  . Obesity, unspecified 11/24/2012  .  Spondylosis, cervical 11/24/2012   Past Medical History:  Diagnosis Date  . Arthritis    in hands  . Asthma    as a child  . Chicken pox   . Eczema   . Hives   . Medical history non-contributory   . Spondylosis 11/24/2012   per xray  . Wears glasses    Past Surgical History:  Procedure Laterality Date  . ABDOMINAL HYSTERECTOMY  2002   VAG HYST-  . CARPAL TUNNEL RELEASE Left 08/30/2012   Procedure: CARPAL TUNNEL RELEASE;  Surgeon: Wynonia Sours, MD;   Location: [place];  Service: Orthopedics;  Laterality: Left;  . COLONOSCOPY    . cyst removal  10/1988   on chest   . KNEE ARTHROSCOPY  1995   RIGHT AND LEFT  . KNEE SURGERY     Bil  . TONSILLECTOMY    . TOTAL KNEE ARTHROPLASTY Left 05/13/2014   Procedure: LEFT TOTAL KNEE ARTHROPLASTY;  Surgeon: Vickey Huger, MD;  Location: [place];  Service: Orthopedics;  Laterality: Left;  . TRIGGER FINGER RELEASE Left 08/30/2012   Procedure: STS RELEASE LEFT THUMB;  Surgeon: Wynonia Sours, MD;  Location: [place];  Service: Orthopedics;  Laterality: Left;   Allergies  Allergen Reactions  . Penicillins      Did not work effectively when she had a skin graft in her mouth.   Prior to Admission medications   Medication Sig Start Date End Date Taking? Authorizing Provider  acetaminophen (TYLENOL) 500 MG tablet Take 1,000 mg by mouth every 6 (six) hours as needed.    [provider]  calcium carbonate 1250 MG capsule Take 1,250 mg by mouth 2 (two) times daily with a meal.    [provider]  cholecalciferol (VITAMIN D) 1000 units tablet Take 1,000 Units by mouth daily.    [provider]  doxylamine, Sleep, (UNISOM) 25 MG tablet Take 50 mg by mouth at bedtime as needed.     [provider]  ELDERBERRY PO Take by mouth.    [provider]  Ginkgo Biloba 100 MG CAPS Take by mouth.    [provider]  HYDROcodone-homatropine (HYCODAN) 5-1.5 MG/5ML syrup Take 5 mLs by mouth every 6 (six) hours as needed for cough. 04/05/17   Weber, Damaris Hippo, PA-C  ibuprofen (ADVIL,MOTRIN) 200 MG tablet Take 200 mg by mouth 2 (two) times daily.    [provider]  Multiple Vitamins-Minerals (MULTIVITAMIN WITH MINERALS) tablet Take 1 tablet by mouth daily.    [provider]  scopolamine (TRANSDERM-SCOP, 1.5 MG,) 1 MG/3DAYS Place 1 patch (1.5 mg total) onto the skin every 3 (three) days. 11/23/17   Wendie Agreste, MD  triamcinolone  cream (KENALOG) 0.1 % Apply 1 application topically 2 (two) times daily. 11/01/15   Wendie Agreste, MD   Social History   Socioeconomic History  . Marital status: Married    Spouse name: Not on file  . Number of children: Not on file  . Years of education: Not on file  . Highest education level: Not on file  Occupational History  . Occupation: Retired.  Social Needs  . Financial resource strain: Not on file  . Food insecurity:    Worry: Not on file    Inability: Not on file  . Transportation needs:    Medical: Not on file    Non-medical: Not on file  Tobacco Use  . Smoking status: Former Smoker    Types:  Cigarettes    Last attempt to quit: 08/26/1978    Years since quitting: 39.3  . Smokeless tobacco: Never Used  Substance and Sexual Activity  . Alcohol use: No    Comment: OCC  . Drug use: No  . Sexual activity: Yes  Lifestyle  . Physical activity:    Days per week: Not on file    Minutes per session: Not on file  . Stress: Not on file  Relationships  . Social connections:    Talks on phone: Not on file    Gets together: Not on file    Attends religious service: Not on file    Active member of club or organization: Not on file    Attends meetings of clubs or organizations: Not on file    Relationship status: Not on file  . Intimate partner violence:    Fear of current or ex partner: Not on file    Emotionally abused: Not on file    Physically abused: Not on file    Forced sexual activity: Not on file  Other Topics Concern  . Not on file  Social History Narrative   Married   College   Does not work   Psychologist, counselling for exercise 2x's weekly.   Review of Systems  Respiratory: Negative for shortness of breath.   Cardiovascular: Negative for chest pain.  Genitourinary: Positive for dyspareunia (ongoing).      Objective:   Physical Exam  Constitutional: She is oriented to person, place, and time. She appears well-developed and well-nourished.  HENT:  Head:  Normocephalic and atraumatic.  Right Ear: External ear normal.  Left Ear: External ear normal.  Mouth/Throat: Oropharynx is clear and moist.  Eyes: Pupils are equal, round, and reactive to light. Conjunctivae are normal.  Neck: Normal range of motion. Neck supple. No thyromegaly present.  Cardiovascular: Normal rate, regular rhythm, normal heart sounds and intact distal pulses.  No murmur heard. Pulmonary/Chest: Effort normal and breath sounds normal. No respiratory distress. She has no wheezes.  Abdominal: Soft. Bowel sounds are normal. There is no tenderness.  Musculoskeletal: Normal range of motion. She exhibits no edema or tenderness.  Lymphadenopathy:    She has no cervical adenopathy.  Neurological: She is alert and oriented to person, place, and time.  Skin: Skin is warm and dry. No rash noted.  Psychiatric: She has a normal mood and affect. Her behavior is normal. Thought content normal.     Vitals:   01/03/18 0910  BP: 134/80  Pulse: 74  Temp: 97.9 F (36.6 C)  TempSrc: Oral  SpO2: 96%  Weight: 200 lb 12.8 oz (91.1 kg)  Height: '5\' 3"'  (1.6 m)   EKG reading done by Wendie Agreste, MD: sinus rhythm rate 66. No changes seen from 2016.    Assessment & Plan:   Cheryl Wang is a 67 y.o. female Welcome to Medicare preventive visit  - - anticipatory guidance as below in AVS, screening labs if needed. Health maintenance items as above in HPI discussed/recommended as applicable.  - no concerning responses on depression, fall, or functional status screening. Any positive responses noted as above. Advanced directives discussed as in CHL.   Family history of early CAD - Plan: EKG 12-Lead Screening for cardiovascular condition - Plan: EKG 12-Lead  -No apparent new or concerning findings on EKG.  Asymptomatic with exercise.  Hyperlipidemia, unspecified hyperlipidemia type - Plan: Comprehensive metabolic panel, Lipid panel  -Check labs, determine ASCVD risk, but with family  history  of coronary disease/early heart disease in her brother, may need to consider statin if borderline levels.  Abnormal CT of thoracic spine - Plan: CT THORACIC SPINE W CONTRAST  -Asymptomatic, possible bone island on previous imaging, repeat CT to evaluate area further.  If normal, further imaging may not be needed.  No orders of the defined types were placed in this encounter.  Patient Instructions    ACIP or CDC for more info on pneumonia vaccine.  I would recommend Prevnar first, then Pneumovax. USPSTF is another agency that reviews screening recommendations.   I would recommend meeting with Dr. Ronnie Derby if more problems with right knee.   I recommend dentist every 6 months.   Keep up the good work with exercise.   I will order the CT of thoracic spine to look at prior abnormality, but that likely is benign.   Thanks for coming in today.    Preventive Care 80 Years and Older, Female Preventive care refers to lifestyle choices and visits with your health care provider that can promote health and wellness. What does preventive care include?  A yearly physical exam. This is also called an annual well check.  Dental exams once or twice a year.  Routine eye exams. Ask your health care provider how often you should have your eyes checked.  Personal lifestyle choices, including: ? Daily care of your teeth and gums. ? Regular physical activity. ? Eating a healthy diet. ? Avoiding tobacco and drug use. ? Limiting alcohol use. ? Practicing safe sex. ? Taking low-dose aspirin every day. ? Taking vitamin and mineral supplements as recommended by your health care provider. What happens during an annual well check? The services and screenings done by your health care provider during your annual well check will depend on your age, overall health, lifestyle risk factors, and family history of disease. Counseling Your health care provider may ask you questions about your:  Alcohol  use.  Tobacco use.  Drug use.  Emotional well-being.  Home and relationship well-being.  Sexual activity.  Eating habits.  History of falls.  Memory and ability to understand (cognition).  Work and work Statistician.  Reproductive health.  Screening You may have the following tests or measurements:  Height, weight, and BMI.  Blood pressure.  Lipid and cholesterol levels. These may be checked every 5 years, or more frequently if you are over 57 years old.  Skin check.  Lung cancer screening. You may have this screening every year starting at age 41 if you have a 30-pack-year history of smoking and currently smoke or have quit within the past 15 years.  Fecal occult blood test (FOBT) of the stool. You may have this test every year starting at age 67.  Flexible sigmoidoscopy or colonoscopy. You may have a sigmoidoscopy every 5 years or a colonoscopy every 10 years starting at age 37.  Hepatitis C blood test.  Hepatitis B blood test.  Sexually transmitted disease (STD) testing.  Diabetes screening. This is done by checking your blood sugar (glucose) after you have not eaten for a while (fasting). You may have this done every 1-3 years.  Bone density scan. This is done to screen for osteoporosis. You may have this done starting at age 40.  Mammogram. This may be done every 1-2 years. Talk to your health care provider about how often you should have regular mammograms.  Talk with your health care provider about your test results, treatment options, and if necessary, the need for more  tests. Vaccines Your health care provider may recommend certain vaccines, such as:  Influenza vaccine. This is recommended every year.  Tetanus, diphtheria, and acellular pertussis (Tdap, Td) vaccine. You may need a Td booster every 10 years.  Varicella vaccine. You may need this if you have not been vaccinated.  Zoster vaccine. You may need this after age 64.  Measles, mumps, and  rubella (MMR) vaccine. You may need at least one dose of MMR if you were born in 1957 or later. You may also need a second dose.  Pneumococcal 13-valent conjugate (PCV13) vaccine. One dose is recommended after age 19.  Pneumococcal polysaccharide (PPSV23) vaccine. One dose is recommended after age 48.  Meningococcal vaccine. You may need this if you have certain conditions.  Hepatitis A vaccine. You may need this if you have certain conditions or if you travel or work in places where you may be exposed to hepatitis A.  Hepatitis B vaccine. You may need this if you have certain conditions or if you travel or work in places where you may be exposed to hepatitis B.  Haemophilus influenzae type b (Hib) vaccine. You may need this if you have certain conditions.  Talk to your health care provider about which screenings and vaccines you need and how often you need them. This information is not intended to replace advice given to you by your health care provider. Make sure you discuss any questions you have with your health care provider. Document Released: 03/14/2015 Document Revised: 11/05/2015 Document Reviewed: 12/17/2014 Elsevier Interactive Patient Education  [employer].   If you have lab work done today you will be contacted with your lab results within the next 2 weeks.  If you have not heard from [place] then please contact us. The fastest way to get your results is to register for My Chart.   IF you received an x-ray today, you will receive an invoice from [facility]. Please contact [facility] at [phone] with questions or concerns regarding your invoice.   IF you received labwork today, you will receive an invoice from [facility]. Please contact [facility] at [phone] with questions or concerns regarding your invoice.   Our billing staff will not be able to assist you with questions regarding bills from these companies.  You will be contacted with the lab  results as soon as they are available. The fastest way to get your results is to activate your My Chart account. Instructions are located on the last page of this paperwork. If you have not heard from [place] regarding the results in 2 weeks, please contact this office.       I personally performed the services described in this documentation, which was scribed in my presence. The recorded information has been reviewed and considered for accuracy and completeness, addended by me as needed, and agree with information above.  Signed,   Merri Ray, MD Primary Care at [place].  01/03/18 1:52 PM

## 2018-01-03 NOTE — Patient Instructions (Addendum)
ACIP or CDC for more info on pneumonia vaccine.  I would recommend Prevnar first, then Pneumovax. USPSTF is another agency that reviews screening recommendations.   I would recommend meeting with Dr. Ronnie Derby if more problems with right knee.   I recommend dentist every 6 months.   Keep up the good work with exercise.   I will order the CT of thoracic spine to look at prior abnormality, but that likely is benign.   Thanks for coming in today.    Preventive Care 36 Years and Older, Female Preventive care refers to lifestyle choices and visits with your health care provider that can promote health and wellness. What does preventive care include?  A yearly physical exam. This is also called an annual well check.  Dental exams once or twice a year.  Routine eye exams. Ask your health care provider how often you should have your eyes checked.  Personal lifestyle choices, including: ? Daily care of your teeth and gums. ? Regular physical activity. ? Eating a healthy diet. ? Avoiding tobacco and drug use. ? Limiting alcohol use. ? Practicing safe sex. ? Taking low-dose aspirin every day. ? Taking vitamin and mineral supplements as recommended by your health care provider. What happens during an annual well check? The services and screenings done by your health care provider during your annual well check will depend on your age, overall health, lifestyle risk factors, and family history of disease. Counseling Your health care provider may ask you questions about your:  Alcohol use.  Tobacco use.  Drug use.  Emotional well-being.  Home and relationship well-being.  Sexual activity.  Eating habits.  History of falls.  Memory and ability to understand (cognition).  Work and work Statistician.  Reproductive health.  Screening You may have the following tests or measurements:  Height, weight, and BMI.  Blood pressure.  Lipid and cholesterol levels. These may be  checked every 5 years, or more frequently if you are over 3 years old.  Skin check.  Lung cancer screening. You may have this screening every year starting at age 47 if you have a 30-pack-year history of smoking and currently smoke or have quit within the past 15 years.  Fecal occult blood test (FOBT) of the stool. You may have this test every year starting at age 94.  Flexible sigmoidoscopy or colonoscopy. You may have a sigmoidoscopy every 5 years or a colonoscopy every 10 years starting at age 40.  Hepatitis C blood test.  Hepatitis B blood test.  Sexually transmitted disease (STD) testing.  Diabetes screening. This is done by checking your blood sugar (glucose) after you have not eaten for a while (fasting). You may have this done every 1-3 years.  Bone density scan. This is done to screen for osteoporosis. You may have this done starting at age 67.  Mammogram. This may be done every 1-2 years. Talk to your health care provider about how often you should have regular mammograms.  Talk with your health care provider about your test results, treatment options, and if necessary, the need for more tests. Vaccines Your health care provider may recommend certain vaccines, such as:  Influenza vaccine. This is recommended every year.  Tetanus, diphtheria, and acellular pertussis (Tdap, Td) vaccine. You may need a Td booster every 10 years.  Varicella vaccine. You may need this if you have not been vaccinated.  Zoster vaccine. You may need this after age 63.  Measles, mumps, and rubella (MMR) vaccine. You may  need at least one dose of MMR if you were born in 1957 or later. You may also need a second dose.  Pneumococcal 13-valent conjugate (PCV13) vaccine. One dose is recommended after age 25.  Pneumococcal polysaccharide (PPSV23) vaccine. One dose is recommended after age 65.  Meningococcal vaccine. You may need this if you have certain conditions.  Hepatitis A vaccine. You may  need this if you have certain conditions or if you travel or work in places where you may be exposed to hepatitis A.  Hepatitis B vaccine. You may need this if you have certain conditions or if you travel or work in places where you may be exposed to hepatitis B.  Haemophilus influenzae type b (Hib) vaccine. You may need this if you have certain conditions.  Talk to your health care provider about which screenings and vaccines you need and how often you need them. This information is not intended to replace advice given to you by your health care provider. Make sure you discuss any questions you have with your health care provider. Document Released: 03/14/2015 Document Revised: 11/05/2015 Document Reviewed: 12/17/2014 Elsevier Interactive Patient Education  [employer].   If you have lab work done today you will be contacted with your lab results within the next 2 weeks.  If you have not heard from [place] then please contact us. The fastest way to get your results is to register for My Chart.   IF you received an x-ray today, you will receive an invoice from [facility]. Please contact [facility] at [phone] with questions or concerns regarding your invoice.   IF you received labwork today, you will receive an invoice from [facility]. Please contact [facility] at [phone] with questions or concerns regarding your invoice.   Our billing staff will not be able to assist you with questions regarding bills from these companies.  You will be contacted with the lab results as soon as they are available. The fastest way to get your results is to activate your My Chart account. Instructions are located on the last page of this paperwork. If you have not heard from [place] regarding the results in 2 weeks, please contact this office.

## 2018-05-09 DIAGNOSIS — M25561 Pain in right knee: Secondary | ICD-10-CM | POA: Diagnosis not present

## 2018-05-09 DIAGNOSIS — G8929 Other chronic pain: Secondary | ICD-10-CM | POA: Diagnosis not present

## 2018-07-03 DIAGNOSIS — M7582 Other shoulder lesions, left shoulder: Secondary | ICD-10-CM | POA: Diagnosis not present

## 2019-01-09 DIAGNOSIS — Z23 Encounter for immunization: Secondary | ICD-10-CM | POA: Diagnosis not present

## 2019-01-09 DIAGNOSIS — Z0000 Encounter for general adult medical examination without abnormal findings: Secondary | ICD-10-CM | POA: Diagnosis not present

## 2019-01-09 DIAGNOSIS — Z7689 Persons encountering health services in other specified circumstances: Secondary | ICD-10-CM | POA: Diagnosis not present

## 2019-01-09 DIAGNOSIS — E782 Mixed hyperlipidemia: Secondary | ICD-10-CM | POA: Diagnosis not present

## 2019-01-09 DIAGNOSIS — R7989 Other specified abnormal findings of blood chemistry: Secondary | ICD-10-CM | POA: Diagnosis not present

## 2019-01-09 DIAGNOSIS — Z6834 Body mass index (BMI) 34.0-34.9, adult: Secondary | ICD-10-CM | POA: Diagnosis not present

## 2019-01-09 DIAGNOSIS — E669 Obesity, unspecified: Secondary | ICD-10-CM | POA: Diagnosis not present

## 2019-01-10 DIAGNOSIS — Z1231 Encounter for screening mammogram for malignant neoplasm of breast: Secondary | ICD-10-CM | POA: Diagnosis not present

## 2019-01-10 DIAGNOSIS — Z124 Encounter for screening for malignant neoplasm of cervix: Secondary | ICD-10-CM | POA: Diagnosis not present

## 2019-01-10 DIAGNOSIS — Z779 Other contact with and (suspected) exposures hazardous to health: Secondary | ICD-10-CM | POA: Diagnosis not present

## 2019-01-10 DIAGNOSIS — Z6834 Body mass index (BMI) 34.0-34.9, adult: Secondary | ICD-10-CM | POA: Diagnosis not present

## 2019-01-15 DIAGNOSIS — E782 Mixed hyperlipidemia: Secondary | ICD-10-CM | POA: Diagnosis not present

## 2019-01-15 DIAGNOSIS — R7989 Other specified abnormal findings of blood chemistry: Secondary | ICD-10-CM | POA: Diagnosis not present

## 2019-02-12 ENCOUNTER — Ambulatory Visit (HOSPITAL_COMMUNITY)
Admission: EM | Admit: 2019-02-12 | Discharge: 2019-02-12 | Disposition: A | Discharge status: Home / Self Care | Payer: Medicare Other | Attending: Family Medicine | Admitting: Family Medicine

## 2019-02-12 ENCOUNTER — Other Ambulatory Visit: Payer: Self-pay

## 2019-02-12 ENCOUNTER — Encounter (HOSPITAL_COMMUNITY): Payer: Self-pay

## 2019-02-12 ENCOUNTER — Telehealth: Payer: Medicare Other

## 2019-02-12 DIAGNOSIS — Z20822 Contact with and (suspected) exposure to covid-19: Secondary | ICD-10-CM

## 2019-02-12 DIAGNOSIS — Z20828 Contact with and (suspected) exposure to other viral communicable diseases: Secondary | ICD-10-CM | POA: Insufficient documentation

## 2019-02-12 NOTE — ED Triage Notes (Signed)
Pt present possibly exposure to covid 19. Pt denies having any symptoms at this time.

## 2019-02-12 NOTE — ED Provider Notes (Signed)
[facility]    CSN: [id number] Arrival date & time: 02/12/19  1319      History   Chief Complaint Chief Complaint  Patient presents with  . Exposure to covid 19    HPI Cheryl Wang is a 68 y.o. female.   Pt is a 68 year old female that presents for COVID testing. Possibly exposed by her husband. He was exposed at work.  He is currently with sick symptoms pending COVID test. She is asymptomatic.      Past Medical History:  Diagnosis Date  . Arthritis    in hands  . Asthma    as a child  . Chicken pox   . Eczema   . Hives   . Medical history non-contributory   . Spondylosis 11/24/2012   per xray  . Wears glasses     Patient Active Problem List   Diagnosis Date Noted  . S/P total knee arthroplasty 05/13/2014  . Hx of migraines 11/24/2012  . Left knee DJD 11/24/2012  . Right knee DJD 11/24/2012  . Obesity, unspecified 11/24/2012  . Spondylosis, cervical 11/24/2012    Past Surgical History:  Procedure Laterality Date  . ABDOMINAL HYSTERECTOMY  2002   VAG HYST-  . CARPAL TUNNEL RELEASE Left 08/30/2012   Procedure: CARPAL TUNNEL RELEASE;  Surgeon: Nicki ReaperGary R Kuzma, MD;  Location: [facility];  Service: Orthopedics;  Laterality: Left;  . COLONOSCOPY    . cyst removal  10/1988   on chest   . KNEE ARTHROSCOPY  1995   RIGHT AND LEFT  . KNEE SURGERY     Bil  . TONSILLECTOMY    . TOTAL KNEE ARTHROPLASTY Left 05/13/2014   Procedure: LEFT TOTAL KNEE ARTHROPLASTY;  Surgeon: Dannielle HuhSteve Lucey, MD;  Location[facility];  Service: Orthopedics;  Laterality: Left;  . TRIGGER FINGER RELEASE Left 08/30/2012   Procedure: STS RELEASE LEFT THUMB;  Surgeon: Nicki ReaperGary R Kuzma, MD;  Location: [facility];  Service: Orthopedics;  Laterality: Left;    OB History   No obstetric history on file.      Home Medications    Prior to Admission medications   Medication Sig Start Date End Date Taking? Authorizing Provider  acetaminophen (TYLENOL) 500 MG tablet  Take 1,000 mg by mouth every 6 (six) hours as needed.    [provider]  calcium carbonate 1250 MG capsule Take 1,250 mg by mouth 2 (two) times daily with a meal.    [provider]  cholecalciferol (VITAMIN D) 1000 units tablet Take 1,000 Units by mouth daily.    [provider]  doxylamine, Sleep, (UNISOM) 25 MG tablet Take 50 mg by mouth at bedtime as needed.     [provider]  ELDERBERRY PO Take by mouth.    [provider]  Ginkgo Biloba 100 MG CAPS Take by mouth.    [provider]  ibuprofen (ADVIL,MOTRIN) 200 MG tablet Take 200 mg by mouth 2 (two) times daily.    [provider]  Multiple Vitamins-Minerals (MULTIVITAMIN WITH MINERALS) tablet Take 1 tablet by mouth daily.    [provider]  scopolamine (TRANSDERM-SCOP, 1.5 MG,) 1 MG/3DAYS Place 1 patch (1.5 mg total) onto the skin every 3 (three) days. 11/23/17   Shade FloodGreene, Jeffrey R, MD  triamcinolone cream (KENALOG) 0.1 % Apply 1 application topically 2 (two) times daily. 11/01/15   Shade FloodGreene, Jeffrey R, MD    Family History Family History  Problem Relation Age of  Onset  . Anorectal malformation Son   . Heart disease Brother   . Hyperlipidemia Father     Social History Social History   Tobacco Use  . Smoking status: Former Smoker    Types: Cigarettes    Quit date: 08/26/1978    Years since quitting: 40.4  . Smokeless tobacco: Never Used  Substance Use Topics  . Alcohol use: No    Comment: OCC  . Drug use: No     Allergies   Penicillins   Review of Systems Review of Systems  Constitutional: Negative for chills and fever.  HENT: Negative for ear pain and sore throat.   Eyes: Negative for pain and visual disturbance.  Respiratory: Negative for cough and shortness of breath.   Cardiovascular: Negative for chest pain and palpitations.  Gastrointestinal: Negative for abdominal pain and vomiting.  Genitourinary: Negative for dysuria and hematuria.    Musculoskeletal: Negative for arthralgias and back pain.  Skin: Negative for color change and rash.  Neurological: Negative for seizures and syncope.  All other systems reviewed and are negative.    Physical Exam Triage Vital Signs ED Triage Vitals  Enc Vitals Group     BP 02/12/19 1408 (!) 153/81     Pulse Rate 02/12/19 1408 71     Resp 02/12/19 1408 16     Temp 02/12/19 1408 98.5 F (36.9 C)     Temp Source 02/12/19 1408 Oral     SpO2 02/12/19 1408 99 %     Weight --      Height --      Head Circumference --      Peak Flow --      Pain Score 02/12/19 1409 0     Pain Loc --      Pain Edu? --      Excl. in GC? --    No data found.  Updated Vital Signs BP (!) 153/81 (BP Location: Right Arm)   Pulse 71   Temp 98.5 F (36.9 C) (Oral)   Resp 16   SpO2 99%   Visual Acuity Right Eye Distance:   Left Eye Distance:   Bilateral Distance:    Right Eye Near:   Left Eye Near:    Bilateral Near:     Physical Exam Vitals and nursing note reviewed.  Constitutional:      General: She is not in acute distress.    Appearance: Normal appearance. She is not ill-appearing, toxic-appearing or diaphoretic.  HENT:     Head: Normocephalic.     Nose: Nose normal.     Mouth/Throat:     Pharynx: Oropharynx is clear.  Eyes:     Conjunctiva/sclera: Conjunctivae normal.  Pulmonary:     Effort: Pulmonary effort is normal.  Musculoskeletal:        General: Normal range of motion.     Cervical back: Normal range of motion.  Skin:    General: Skin is warm and dry.     Findings: No rash.  Neurological:     Mental Status: She is alert.  Psychiatric:        Mood and Affect: Mood normal.      UC Treatments / Results  Labs (all labs ordered are listed, but only abnormal results are displayed) Labs Reviewed  NOVEL CORONAVIRUS, NAA (HOSP ORDER, SEND-OUT TO REF LAB; TAT 18-24 HRS)    EKG   Radiology No results found.  Procedures Procedures (including critical care  time)  Medications Ordered in UC  Medications - No data to display  Initial Impression / Assessment and Plan / UC Course  I have reviewed the triage vital signs and the nursing notes.  Pertinent labs & imaging results that were available during my care of the patient were reviewed by me and considered in my medical decision making (see chart for details).     COVID exposure- swab sent for testing with labs pending.  Precautions given.  Final Clinical Impressions(s) / UC Diagnoses   Final diagnoses:  Exposure to COVID-19 virus     Discharge Instructions     We should have your results in a few days.  Make sure you are taking precautions until we get the results.  Follow up as needed for continued or worsening symptoms     ED Prescriptions    None     PDMP not reviewed this encounter.   Orvan July, NP 02/12/19 1435

## 2019-02-12 NOTE — Discharge Instructions (Signed)
We should have your results in a few days.  Make sure you are taking precautions until we get the results.  Follow up as needed for continued or worsening symptoms

## 2019-02-14 LAB — NOVEL CORONAVIRUS, NAA (HOSP ORDER, SEND-OUT TO REF LAB; TAT 18-24 HRS): SARS-CoV-2, NAA: NOT DETECTED

## 2019-11-21 ENCOUNTER — Telehealth: Payer: Self-pay | Admitting: *Deleted

## 2019-11-21 NOTE — Telephone Encounter (Signed)
Schedule a mobile mammogram 9-27 at [facility] [place]

## 2019-12-12 DIAGNOSIS — U071 COVID-19: Secondary | ICD-10-CM | POA: Diagnosis not present

## 2020-01-14 DIAGNOSIS — Z0000 Encounter for general adult medical examination without abnormal findings: Secondary | ICD-10-CM | POA: Diagnosis not present

## 2020-01-14 DIAGNOSIS — M5412 Radiculopathy, cervical region: Secondary | ICD-10-CM | POA: Diagnosis not present

## 2022-10-04 NOTE — Progress Notes (Unsigned)
   Rubin Payor, PhD, LAT, ATC acting as a scribe for Clementeen Graham, MD.  Cheryl Wang is a 72 y.o. female who presents to [facility] today for L shoulder pain x ***. Pt locates pain to ***  Neck pain: Radiates: Aggravates: Treatments tried:  Pertinent review of systems: ***  Relevant historical information: ***   Exam:  There were no vitals taken for this visit. General: Well Developed, well nourished, and in no acute distress.   MSK: ***    Lab and Radiology Results No results found for this or any previous visit (from the past 72 hour(s)). No results found.     Assessment and Plan: 72 y.o. female with ***   PDMP not reviewed this encounter. No orders of the defined types were placed in this encounter.  No orders of the defined types were placed in this encounter.    Discussed warning signs or symptoms. Please see discharge instructions. Patient expresses understanding.   ***

## 2022-10-05 ENCOUNTER — Ambulatory Visit (INDEPENDENT_AMBULATORY_CARE_PROVIDER_SITE_OTHER): Payer: Medicare Other

## 2022-10-05 ENCOUNTER — Other Ambulatory Visit: Payer: Self-pay

## 2022-10-05 ENCOUNTER — Encounter: Payer: Self-pay | Admitting: Family Medicine

## 2022-10-05 ENCOUNTER — Ambulatory Visit (INDEPENDENT_AMBULATORY_CARE_PROVIDER_SITE_OTHER): Payer: Medicare Other | Admitting: Family Medicine

## 2022-10-05 VITALS — BP 120/82 | HR 80 | Ht 63.0 in | Wt 205.8 lb

## 2022-10-05 DIAGNOSIS — M25512 Pain in left shoulder: Secondary | ICD-10-CM

## 2022-10-05 DIAGNOSIS — G8929 Other chronic pain: Secondary | ICD-10-CM | POA: Diagnosis not present

## 2022-10-05 NOTE — Patient Instructions (Signed)
Thank you for coming in today.   Please get an Xray today before you leave   I've referred you to Physical Therapy.  Let us know if you don't hear from them in one week.   Recheck in about 6 weeks.   Let me know if this is not working or you have any problems.

## 2022-10-06 ENCOUNTER — Ambulatory Visit: Payer: Medicare Other

## 2022-10-08 ENCOUNTER — Encounter: Payer: Self-pay | Admitting: Rehabilitative and Restorative Service Providers"

## 2022-10-08 ENCOUNTER — Other Ambulatory Visit: Payer: Self-pay

## 2022-10-08 ENCOUNTER — Ambulatory Visit: Payer: Medicare Other | Attending: Family Medicine | Admitting: Rehabilitative and Restorative Service Providers"

## 2022-10-08 DIAGNOSIS — M25512 Pain in left shoulder: Secondary | ICD-10-CM | POA: Insufficient documentation

## 2022-10-08 DIAGNOSIS — Z9181 History of falling: Secondary | ICD-10-CM | POA: Insufficient documentation

## 2022-10-08 DIAGNOSIS — G8929 Other chronic pain: Secondary | ICD-10-CM | POA: Insufficient documentation

## 2022-10-08 DIAGNOSIS — R252 Cramp and spasm: Secondary | ICD-10-CM

## 2022-10-08 DIAGNOSIS — M6281 Muscle weakness (generalized): Secondary | ICD-10-CM | POA: Insufficient documentation

## 2022-10-08 NOTE — Therapy (Signed)
OUTPATIENT PHYSICAL THERAPY SHOULDER EVALUATION   Patient Name: Cheryl Wang MRN: [id number] DOB:[birth year], 72 y.o., female Today's Date: 10/08/2022  END OF SESSION:  PT End of Session - 10/08/22 0813     Visit Number 1    Date for PT Re-Evaluation 12/03/22    Authorization Type Medicare A    PT Start Time 0800    PT Stop Time 0840    PT Time Calculation (min) 40 min    Activity Tolerance Patient tolerated treatment well    Behavior During Therapy [facility] for tasks assessed/performed             Past Medical History:  Diagnosis Date   Arthritis    in hands   Asthma    as a child   Chicken pox    Eczema    Hives    Medical history non-contributory    Spondylosis 11/24/2012   per xray   Wears glasses    Past Surgical History:  Procedure Laterality Date   ABDOMINAL HYSTERECTOMY  2002   VAG HYST-   CARPAL TUNNEL RELEASE Left 08/30/2012   Procedure: CARPAL TUNNEL RELEASE;  Surgeon: Nicki Reaper, MD;  Location: [facility];  Service: Orthopedics;  Laterality: Left;   COLONOSCOPY     cyst removal  10/1988   on chest    KNEE ARTHROSCOPY  1995   RIGHT AND LEFT   KNEE SURGERY     Bil   TONSILLECTOMY     TOTAL KNEE ARTHROPLASTY Left 05/13/2014   Procedure: LEFT TOTAL KNEE ARTHROPLASTY;  Surgeon: Dannielle Huh, MD;  [facility];  Service: Orthopedics;  Laterality: Left;   TRIGGER FINGER RELEASE Left 08/30/2012   Procedure: STS RELEASE LEFT THUMB;  Surgeon: Nicki Reaper, MD;  Location: [facility];  Service: Orthopedics;  Laterality: Left;   Patient Active Problem List   Diagnosis Date Noted   S/P total knee arthroplasty 05/13/2014   Hx of migraines 11/24/2012   Left knee DJD 11/24/2012   Right knee DJD 11/24/2012   Obesity, unspecified 11/24/2012   Spondylosis, cervical 11/24/2012    PCP: Selina Cooley, MD  REFERRING PROVIDER: Rodolph Bong, MD  REFERRING DIAG: [phone] (ICD-10-CM) - Chronic left shoulder pain  THERAPY DIAG:   Chronic left shoulder pain  Muscle weakness (generalized)  Cramp and spasm  Rationale for Evaluation and Treatment: Rehabilitation  ONSET DATE: September 2023 had a fall when she landed on her shoulder  SUBJECTIVE:  SUBJECTIVE STATEMENT: Pt reports that she had a fall in September of 2023 when she tripped and landed on cement and had some injuries.  However, 3 weeks ago, pt had another fall where she tripped over a weed eater and landed in the grass on her left shoulder.  She states that since that time, she has been having some pain and difficulty with overhead exercises or planks. Hand dominance: Right  PERTINENT HISTORY: Bilateral TKA, hysterectomy, left wrist Fx  PAIN:  Are you having pain? Yes: NPRS scale: 5-8/10 Pain location: left shoulder Pain description: aching Aggravating factors: planks, reaching behind back, overhead exercises Relieving factors: rest, and hanging arm down, distraction  PRECAUTIONS: None  RED FLAGS: None   WEIGHT BEARING RESTRICTIONS: No  FALLS:  Has patient fallen in last 6 months? Yes. 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  NEXT MD VISIT: Dr Denyse Amass November 16, 2022  OBJECTIVE:   DIAGNOSTIC FINDINGS:  Left shoulder radiograph on 10/05/2022: Per Dr Logan Bores:  Mild to moderate glenohumeral DJD.  Mild AC DJD.  No acute fractures.  Awaiting formal  review.  PATIENT SURVEYS:  FOTO 57 (projected 66 by visit 10)  COGNITION: Overall cognitive status: Within functional limits for tasks assessed     SENSATION: WFL  POSTURE: Rounded shoulder and slight forward flexed posture with ambulation  UPPER EXTREMITY ROM:   Active ROM Right eval Left eval  Shoulder flexion 146 128  Shoulder extension 50 48  Shoulder abduction 145 132  Shoulder adduction    Shoulder internal rotation To mid thoracic To hip with pain  Shoulder external rotation    Elbow flexion    Elbow extension    Wrist flexion    Wrist extension    Wrist ulnar deviation    Wrist radial deviation    Wrist pronation    Wrist supination    (Blank rows = not tested)  UPPER EXTREMITY MMT:  MMT Right eval Left eval  Shoulder flexion 5- 4-  Shoulder extension    Shoulder abduction 5- 4  Shoulder adduction    Shoulder internal rotation 5 4+  Shoulder external rotation 5 4+  Middle trapezius    Lower trapezius    Elbow flexion    Elbow extension    Wrist flexion    Wrist extension    Wrist ulnar deviation    Wrist radial deviation    Wrist pronation    Wrist supination    Grip strength (lbs)    (Blank rows = not tested)  SHOULDER SPECIAL TESTS: Impingement tests: Hawkins/Kennedy impingement test: positive  Rotator cuff assessment: Empty can test: positive   PALPATION:  Tight bilat upper traps, tender to palpation over left shoulder   TODAY'S TREATMENT:  DATE: 10/08/2022 Reviewed HEP and issued red tband   PATIENT EDUCATION: Education details: Issued HEP Person educated: Patient Education method: Explanation, Demonstration, and Handouts Education comprehension: verbalized understanding and returned demonstration  HOME EXERCISE PROGRAM: Access Code: [email or web address] URL: [email or web address] Date:  10/08/2022 Prepared by: Clydie Braun Liese Dizdarevic  Exercises - Seated Scapular Retraction  - 1 x daily - 7 x weekly - 2 sets - 10 reps - Shoulder External Rotation and Scapular Retraction with Resistance  - 1 x daily - 7 x weekly - 2 sets - 10 reps - Standing Shoulder Horizontal Abduction with Resistance  - 1 x daily - 7 x weekly - 2 sets - 10 reps - Seated Upper Trapezius Stretch  - 1 x daily - 7 x weekly - 2 reps - 20 sec hold - Shoulder Flexion Wall Slide with Towel  - 1 x daily - 7 x weekly - 2 sets - 10 reps - Standing Shoulder Abduction Slides at Wall  - 1 x daily - 7 x weekly - 2 sets - 10 reps  ASSESSMENT:  CLINICAL IMPRESSION: Patient is a 72 y.o. female who was seen today for physical therapy evaluation and treatment for left shoulder pain. Patient's PLOF is independent and able to perform yard work.  Patient presents to PT with minimal right shoulder weakness, but primarily left shoulder weakness.  Pt additionally has pain with reaching overhead with left arm, decreased arm weakness, increased pain, and difficulty performing pain with functional tasks.  Patient would benefit from skilled PT to address her functional impairments.  OBJECTIVE IMPAIRMENTS: decreased ROM, decreased strength, increased muscle spasms, impaired flexibility, postural dysfunction, and pain.   ACTIVITY LIMITATIONS: carrying, lifting, dressing, and reach over head  PARTICIPATION LIMITATIONS: cleaning, laundry, driving, community activity, and yard work  PERSONAL FACTORS: Time since onset of injury/illness/exacerbation and 1 comorbidity: OA  are also affecting patient's functional outcome.   REHAB POTENTIAL: Good  CLINICAL DECISION MAKING: Stable/uncomplicated  EVALUATION COMPLEXITY: Low   GOALS: Goals reviewed with patient? Yes  SHORT TERM GOALS: Target date: 10/22/2022  Pt will be independent with initial HEP. Baseline: Goal status: INITIAL  2.  Pt will report at least 25% improvement in symptoms since  starting skilled PT. Baseline:  Goal status: INITIAL   LONG TERM GOALS: Target date: 11/26/2022  Pt will be independent with advanced HEP to allow for self-progression post discharge. Baseline:  Goal status: INITIAL  2.  Patient will increase FOTO to at least 66 to demonstrate improvements in functional tasks. Baseline: 57 Goal status: INITIAL  3.  Patient will increase left shoulder A/ROM to [facility] to allow her to place objects in overhead cabinets and put on her bra. Baseline:  Goal status: INITIAL  4.  Patient to increase left shoulder strength to at least 5-/5 to allow her to return to ability to perform yard work again. Baseline:  Goal status: INITIAL  5.  Patient to report ability to perform yard work without increased shoulder pain. Baseline:  Goal status: INITIAL   PLAN:  PT FREQUENCY: 1-2x/week  PT DURATION: 8 weeks  PLANNED INTERVENTIONS: Therapeutic exercises, Therapeutic activity, Neuromuscular re-education, Balance training, Gait training, Patient/Family education, Self Care, Joint mobilization, Joint manipulation, Aquatic Therapy, Dry Needling, Electrical stimulation, Spinal manipulation, Spinal mobilization, Cryotherapy, Moist heat, Taping, Traction, Ultrasound, Ionotophoresis 4mg /ml Dexamethasone, Manual therapy, and Re-evaluation  PLAN FOR NEXT SESSION: Assess and progress HEP as indicated, dry needling/manual therapy as indicated, strengthening, flexibility   Reather Laurence, PT, DPT 10/08/22, 9:24 AM  [facility] [address], [address] [place], [place] [address] Phone # [phone] Fax [phone]

## 2022-10-11 ENCOUNTER — Ambulatory Visit: Payer: Medicare Other | Admitting: Rehabilitative and Restorative Service Providers"

## 2022-10-11 ENCOUNTER — Encounter: Payer: Self-pay | Admitting: Rehabilitative and Restorative Service Providers"

## 2022-10-11 DIAGNOSIS — M25512 Pain in left shoulder: Secondary | ICD-10-CM | POA: Diagnosis not present

## 2022-10-11 DIAGNOSIS — G8929 Other chronic pain: Secondary | ICD-10-CM

## 2022-10-11 DIAGNOSIS — M6281 Muscle weakness (generalized): Secondary | ICD-10-CM

## 2022-10-11 DIAGNOSIS — R252 Cramp and spasm: Secondary | ICD-10-CM

## 2022-10-11 NOTE — Therapy (Signed)
OUTPATIENT PHYSICAL THERAPY TREATMENT NOTE   Patient Name: Cheryl Wang MRN: [id number] DOB:24-May-1950, 72 y.o., female Today's Date: 10/11/2022  END OF SESSION:  PT End of Session - 10/11/22 0846     Visit Number 2    Date for PT Re-Evaluation 12/03/22    Authorization Type Medicare A    PT Start Time 0845    PT Stop Time 0932    PT Time Calculation (min) 47 min    Activity Tolerance Patient tolerated treatment well    Behavior During Therapy [facility] for tasks assessed/performed             Past Medical History:  Diagnosis Date   Arthritis    in hands   Asthma    as a child   Chicken pox    Eczema    Hives    Medical history non-contributory    Spondylosis 11/24/2012   per xray   Wears glasses    Past Surgical History:  Procedure Laterality Date   ABDOMINAL HYSTERECTOMY  2002   VAG HYST-   CARPAL TUNNEL RELEASE Left 08/30/2012   Procedure: CARPAL TUNNEL RELEASE;  Surgeon: Nicki Reaper, MD;  Location: [facility];  Service: Orthopedics;  Laterality: Left;   COLONOSCOPY     cyst removal  10/1988   on chest    KNEE ARTHROSCOPY  1995   RIGHT AND LEFT   KNEE SURGERY     Bil   TONSILLECTOMY     TOTAL KNEE ARTHROPLASTY Left 05/13/2014   Procedure: LEFT TOTAL KNEE ARTHROPLASTY;  Surgeon: Dannielle Huh, MD;  Location: [facility];  Service: Orthopedics;  Laterality: Left;   TRIGGER FINGER RELEASE Left 08/30/2012   Procedure: STS RELEASE LEFT THUMB;  Surgeon: Nicki Reaper, MD;  Location: [facility];  Service: Orthopedics;  Laterality: Left;   Patient Active Problem List   Diagnosis Date Noted   S/P total knee arthroplasty 05/13/2014   Hx of migraines 11/24/2012   Left knee DJD 11/24/2012   Right knee DJD 11/24/2012   Obesity, unspecified 11/24/2012   Spondylosis, cervical 11/24/2012    PCP: Selina Cooley, MD  REFERRING PROVIDER: Rodolph Bong, MD  REFERRING DIAG: [phone] (ICD-10-CM) - Chronic left shoulder pain  THERAPY DIAG:   Chronic left shoulder pain  Muscle weakness (generalized)  Cramp and spasm  Rationale for Evaluation and Treatment: Rehabilitation  ONSET DATE: September 2023 had a fall when she landed on her shoulder  SUBJECTIVE:  SUBJECTIVE STATEMENT: Pt reports that she has been doing her exercises and they have been feeling good.  Hand dominance: Right  PERTINENT HISTORY: Bilateral TKA, hysterectomy, left wrist Fx  PAIN:  Are you having pain? Yes: NPRS scale: 6/10 Pain location: left shoulder Pain description: aching Aggravating factors: planks, reaching behind back, overhead exercises Relieving factors: rest, and hanging arm down, distraction  PRECAUTIONS: None  RED FLAGS: None   WEIGHT BEARING RESTRICTIONS: No  FALLS:  Has patient fallen in last 6 months? Yes. 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  NEXT MD VISIT: Dr Denyse Amass November 16, 2022  OBJECTIVE:   DIAGNOSTIC FINDINGS:  Left shoulder radiograph on 10/05/2022: Per Dr Logan Bores:  Mild to moderate glenohumeral DJD.  Mild AC DJD.  No acute fractures.  Awaiting formal review.  PATIENT SURVEYS:  FOTO 57 (projected 66 by visit 10)  COGNITION: Overall cognitive status: Within functional limits for tasks assessed     SENSATION: WFL  POSTURE: Rounded shoulder and slight forward flexed posture with ambulation  UPPER EXTREMITY ROM:   Active  ROM Right eval Left eval  Shoulder flexion 146 128  Shoulder extension 50 48  Shoulder abduction 145 132  Shoulder adduction    Shoulder internal rotation To mid thoracic To hip with pain  Shoulder external rotation    Elbow flexion    Elbow extension    Wrist flexion    Wrist extension    Wrist ulnar deviation    Wrist radial deviation    Wrist pronation    Wrist supination    (Blank rows = not tested)  UPPER EXTREMITY MMT:  MMT Right eval Left eval  Shoulder flexion 5- 4-  Shoulder extension    Shoulder abduction 5- 4  Shoulder adduction    Shoulder internal rotation 5 4+  Shoulder external rotation 5 4+  Middle trapezius    Lower trapezius    Elbow flexion    Elbow extension    Wrist flexion    Wrist extension    Wrist ulnar deviation    Wrist radial deviation    Wrist pronation    Wrist supination    Grip strength (lbs)    (Blank rows = not tested)  SHOULDER SPECIAL TESTS: Impingement tests: Hawkins/Kennedy impingement test: positive  Rotator cuff assessment: Empty can test: positive   PALPATION:  Tight bilat upper traps, tender to palpation over left shoulder   TODAY'S TREATMENT:  DATE: 10/11/2022 UBE level 1.0 x3 min each direction with PT present to discuss status Standing scapular retraction 2x10 Standing rows and shoulder extension with red tband 2x10 each Standing shoulder ER with cuing for core engagement and improved posture and red tband 2x10 Standing shoulder horizontal abduction with red tband and core engagement.  2x10 Seated TA contraction with hands pressing through ball 2x10 Quadruped alt UE/LE extension (bird dog) 2x5 Standing wall wash for shoulder flexion 2x10 bilat Standing facing wall lower trap lift off 2x10 Barre push-ups 2x10 Standing 4D scapular stabilization on wall with 2# light blue ball x20  bilat Supine/hooklying with TA activation shoulder flexion  Cold Pack to left shoulder x5 min   DATE: 10/08/2022 Reviewed HEP and issued red tband   PATIENT EDUCATION: Education details: Issued HEP Person educated: Patient Education method: Explanation, Demonstration, and Handouts Education comprehension: verbalized understanding and returned demonstration  HOME EXERCISE PROGRAM: Access Code: [email or web address] URL: [email or web address] Date: 10/08/2022 Prepared by: Clydie Braun Daritza Brees  Exercises - Seated Scapular Retraction  - 1 x daily - 7 x weekly - 2 sets - 10 reps - Shoulder External Rotation and Scapular Retraction with Resistance  - 1 x daily - 7 x weekly - 2 sets - 10 reps - Standing Shoulder Horizontal Abduction with Resistance  - 1 x daily - 7 x weekly - 2 sets - 10 reps - Seated Upper Trapezius Stretch  - 1 x daily - 7 x weekly - 2 reps - 20 sec hold - Shoulder Flexion Wall Slide with Towel  - 1 x daily - 7 x weekly - 2 sets - 10 reps - Standing Shoulder Abduction Slides at Wall  - 1 x daily - 7 x weekly - 2 sets - 10 reps  ASSESSMENT:  CLINICAL IMPRESSION: Ms Glauser presents to skilled PT reporting that she is doing her HEP and is doing okay with exercises.  Patient with some increase to right shoulder pain with some exercises, but the pain decreases generally with rest.  Patient requires cuing throughout for improved core activation and improved posture. Patient reports decreased pain following use of cold pack at end of session.  OBJECTIVE IMPAIRMENTS: decreased ROM, decreased strength, increased muscle spasms, impaired flexibility, postural dysfunction, and pain.   ACTIVITY LIMITATIONS: carrying, lifting, dressing, and reach over head  PARTICIPATION LIMITATIONS: cleaning, laundry, driving, community activity, and yard work  PERSONAL FACTORS: Time since onset of injury/illness/exacerbation and 1 comorbidity: OA  are also affecting patient's functional outcome.    REHAB POTENTIAL: Good  CLINICAL DECISION MAKING: Stable/uncomplicated  EVALUATION COMPLEXITY: Low   GOALS: Goals reviewed with patient? Yes  SHORT TERM GOALS: Target date: 10/22/2022  Pt will be independent with initial HEP. Baseline: Goal status: ONGOING  2.  Pt will report at least 25% improvement in symptoms since starting skilled PT. Baseline:  Goal status: INITIAL   LONG TERM GOALS: Target date: 11/26/2022  Pt will be independent with advanced HEP to allow for self-progression post discharge. Baseline:  Goal status: INITIAL  2.  Patient will increase FOTO to at least 66 to demonstrate improvements in functional tasks. Baseline: 57 Goal status: INITIAL  3.  Patient will increase left shoulder A/ROM to [facility] to allow her to place objects in overhead cabinets and put on her bra. Baseline:  Goal status: INITIAL  4.  Patient to increase left shoulder strength to at least 5-/5 to allow her to return to ability to perform yard work again. Baseline:  Goal status: INITIAL  5.  Patient to report ability to perform yard work without increased shoulder pain. Baseline:  Goal status: INITIAL   PLAN:  PT FREQUENCY: 1-2x/week  PT DURATION: 8 weeks  PLANNED INTERVENTIONS: Therapeutic exercises, Therapeutic activity, Neuromuscular re-education, Balance training, Gait training, Patient/Family education, Self Care, Joint mobilization, Joint manipulation, Aquatic Therapy, Dry Needling, Electrical stimulation, Spinal manipulation, Spinal mobilization, Cryotherapy, Moist heat, Taping, Traction, Ultrasound, Ionotophoresis 4mg /ml Dexamethasone, Manual therapy, and Re-evaluation  PLAN FOR NEXT SESSION: Assess and progress HEP as indicated, dry needling/manual therapy as indicated, strengthening, flexibility   Reather Laurence, PT, DPT 10/11/22, 10:23 AM  [facility] [address] [place], [place] [address] Phone # [phone] Fax  [phone]

## 2022-10-12 ENCOUNTER — Ambulatory Visit: Payer: Medicare Other | Admitting: Rehabilitative and Restorative Service Providers"

## 2022-10-18 ENCOUNTER — Ambulatory Visit: Payer: Medicare Other | Admitting: Rehabilitative and Restorative Service Providers"

## 2022-10-18 ENCOUNTER — Encounter: Payer: Self-pay | Admitting: Rehabilitative and Restorative Service Providers"

## 2022-10-18 DIAGNOSIS — G8929 Other chronic pain: Secondary | ICD-10-CM

## 2022-10-18 DIAGNOSIS — R252 Cramp and spasm: Secondary | ICD-10-CM

## 2022-10-18 DIAGNOSIS — M6281 Muscle weakness (generalized): Secondary | ICD-10-CM

## 2022-10-18 DIAGNOSIS — M25512 Pain in left shoulder: Secondary | ICD-10-CM | POA: Diagnosis not present

## 2022-10-18 NOTE — Therapy (Signed)
OUTPATIENT PHYSICAL THERAPY TREATMENT NOTE   Patient Name: Cheryl Wang MRN: [id number] DOB:November 07, 1950, 72 y.o., female Today's Date: 10/18/2022  END OF SESSION:  PT End of Session - 10/18/22 0933     Visit Number 3    Date for PT Re-Evaluation 12/03/22    Authorization Type Medicare A    PT Start Time 0930    PT Stop Time 1010    PT Time Calculation (min) 40 min    Activity Tolerance Patient tolerated treatment well    Behavior During Therapy [facility] for tasks assessed/performed             Past Medical History:  Diagnosis Date   Arthritis    in hands   Asthma    as a child   Chicken pox    Eczema    Hives    Medical history non-contributory    Spondylosis 11/24/2012   per xray   Wears glasses    Past Surgical History:  Procedure Laterality Date   ABDOMINAL HYSTERECTOMY  2002   VAG HYST-   CARPAL TUNNEL RELEASE Left 08/30/2012   Procedure: CARPAL TUNNEL RELEASE;  Surgeon: Nicki Reaper, MD;  Location: [facility];  Service: Orthopedics;  Laterality: Left;   COLONOSCOPY     cyst removal  10/1988   on chest    KNEE ARTHROSCOPY  1995   RIGHT AND LEFT   KNEE SURGERY     Bil   TONSILLECTOMY     TOTAL KNEE ARTHROPLASTY Left 05/13/2014   Procedure: LEFT TOTAL KNEE ARTHROPLASTY;  Surgeon: Dannielle Huh, MD;  Location: [facility];  Service: Orthopedics;  Laterality: Left;   TRIGGER FINGER RELEASE Left 08/30/2012   Procedure: STS RELEASE LEFT THUMB;  Surgeon: Nicki Reaper, MD;  Location: [facility];  Service: Orthopedics;  Laterality: Left;   Patient Active Problem List   Diagnosis Date Noted   S/P total knee arthroplasty 05/13/2014   Hx of migraines 11/24/2012   Left knee DJD 11/24/2012   Right knee DJD 11/24/2012   Obesity, unspecified 11/24/2012   Spondylosis, cervical 11/24/2012    PCP: Selina Cooley, MD  REFERRING PROVIDER: Rodolph Bong, MD  REFERRING DIAG: [phone] (ICD-10-CM) - Chronic left shoulder pain  THERAPY DIAG:   Chronic left shoulder pain  Muscle weakness (generalized)  Cramp and spasm  Rationale for Evaluation and Treatment: Rehabilitation  ONSET DATE: September 2023 had a fall when she landed on her shoulder  SUBJECTIVE:  SUBJECTIVE STATEMENT: Pt reports that her shoulder has been feeling better.  She has been paining a picnic table.  She states last night is the best that she has slept in years.  Hand dominance: Right  PERTINENT HISTORY: Bilateral TKA, hysterectomy, left wrist Fx  PAIN:  Are you having pain? Yes: NPRS scale: 3/10 Pain location: left shoulder Pain description: aching Aggravating factors: planks, reaching behind back, overhead exercises Relieving factors: rest, and hanging arm down, distraction  PRECAUTIONS: None  RED FLAGS: None   WEIGHT BEARING RESTRICTIONS: No  FALLS:  Has patient fallen in last 6 months? Yes. 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  NEXT MD VISIT: Dr Denyse Amass November 16, 2022  OBJECTIVE:   DIAGNOSTIC FINDINGS:  Left shoulder radiograph on 10/05/2022: Per Dr Logan Bores:  Mild to moderate glenohumeral DJD.  Mild AC DJD.  No acute fractures.  Awaiting formal review.  PATIENT SURVEYS:  Eval:  FOTO 57 (projected 66 by visit 10)  COGNITION: Overall cognitive status: Within functional limits for tasks assessed     SENSATION: WFL  POSTURE: Rounded shoulder and  slight forward flexed posture with ambulation  UPPER EXTREMITY ROM:   Active ROM Right eval Left eval  Shoulder flexion 146 128  Shoulder extension 50 48  Shoulder abduction 145 132  Shoulder adduction    Shoulder internal rotation To mid thoracic To hip with pain  Shoulder external rotation    Elbow flexion    Elbow extension    Wrist flexion    Wrist extension    Wrist ulnar deviation    Wrist radial deviation    Wrist pronation    Wrist supination    (Blank rows = not tested)  UPPER EXTREMITY MMT:  MMT Right eval Left eval  Shoulder flexion 5- 4-  Shoulder extension    Shoulder abduction 5- 4  Shoulder adduction    Shoulder internal rotation 5 4+  Shoulder external rotation 5 4+  Middle trapezius    Lower trapezius    Elbow flexion    Elbow extension    Wrist flexion    Wrist extension    Wrist ulnar deviation    Wrist radial deviation    Wrist pronation    Wrist supination    Grip strength (lbs)    (Blank rows = not tested)  SHOULDER SPECIAL TESTS: Impingement tests: Hawkins/Kennedy impingement test: positive  Rotator cuff assessment: Empty can test: positive   PALPATION:  Tight bilat upper traps, tender to palpation over left shoulder   TODAY'S TREATMENT:  DATE: 10/18/2022 UBE level 1.0 x3 min each direction with PT present to discuss status Standing shoulder ER with cuing for core engagement and improved posture and red tband 2x10 Standing shoulder horizontal abduction with red tband and core engagement.  2x10 Seated TA contraction with hands pressing through ball 2x10 Standing wall wash for shoulder flexion and shoulder abduction 2x10 each bilat Standing facing wall lower trap lift off 2x10 Standing 4D scapular stabilization on wall with 2# light blue ball x20 bilat Quadruped alt UE/LE extension (bird dog)  2x5 Child's Pose x20 sec Supine/hooklying with TA activation shoulder flexion with 1# 2x10 bilat Supine/hooklying with TA activation serratus punch with 1# 2x10   DATE: 10/11/2022 UBE level 1.0 x3 min each direction with PT present to discuss status Standing scapular retraction 2x10 Standing rows and shoulder extension with red tband 2x10 each Standing shoulder ER with cuing for core engagement and improved posture and red tband 2x10 Standing shoulder horizontal abduction with red tband and core engagement.  2x10 Seated TA contraction with hands pressing through ball 2x10 Quadruped alt UE/LE extension (bird dog) 2x5 Standing wall wash for shoulder flexion 2x10 bilat Standing facing wall lower trap lift off 2x10 Barre push-ups 2x10 Standing 4D scapular stabilization on wall with 2# light blue ball x20 bilat Supine/hooklying with TA activation shoulder flexion  Cold Pack to left shoulder x5 min    PATIENT EDUCATION: Education details: Issued HEP Person educated: Patient Education method: Explanation, Demonstration, and Handouts Education comprehension: verbalized understanding and returned demonstration  HOME EXERCISE PROGRAM: Access Code: [email or web address] URL: [email or web address] Date: 10/08/2022 Prepared by: Clydie Braun Perri Lamagna  Exercises - Seated Scapular Retraction  - 1 x daily - 7 x weekly - 2 sets - 10 reps - Shoulder External Rotation and Scapular Retraction with Resistance  - 1 x daily - 7 x weekly - 2 sets - 10 reps - Standing Shoulder Horizontal Abduction with Resistance  - 1 x daily - 7 x weekly - 2 sets - 10 reps - Seated Upper Trapezius Stretch  - 1 x daily - 7 x weekly - 2 reps - 20 sec hold - Shoulder Flexion Wall Slide with Towel  - 1 x daily - 7 x weekly - 2 sets - 10 reps - Standing Shoulder Abduction Slides at Wall  - 1 x daily - 7 x weekly - 2 sets - 10 reps  ASSESSMENT:  CLINICAL IMPRESSION: Ms Nara presents to skilled PT reporting that she is  feeling significantly better and has been able to paint a picnic table yesterday and plans on varnishing it later today.  Patient continues to progress with strengthening and does require cuing throughout for improved posture and TA activation to decrease anterior pelvic tilt/increased lordosis.  Patient with improved form noted on exercises today overall and reports that she is having less pain today with exercises and did not require cold pack at end of session.  OBJECTIVE IMPAIRMENTS: decreased ROM, decreased strength, increased muscle spasms, impaired flexibility, postural dysfunction, and pain.   ACTIVITY LIMITATIONS: carrying, lifting, dressing, and reach over head  PARTICIPATION LIMITATIONS: cleaning, laundry, driving, community activity, and yard work  PERSONAL FACTORS: Time since onset of injury/illness/exacerbation and 1 comorbidity: OA  are also affecting patient's functional outcome.   REHAB POTENTIAL: Good  CLINICAL DECISION MAKING: Stable/uncomplicated  EVALUATION COMPLEXITY: Low   GOALS: Goals reviewed with patient? Yes  SHORT TERM GOALS: Target date: 10/22/2022  Pt will be independent with initial HEP. Baseline: Goal status: MET on 10/18/2022  2.  Pt will report at least 25% improvement in symptoms since starting skilled PT. Baseline:  Goal status: MET on 10/18/2022   LONG TERM GOALS: Target date: 11/26/2022  Pt will be independent with advanced HEP to allow for self-progression post discharge. Baseline:  Goal status: INITIAL  2.  Patient will increase FOTO to at least 66 to demonstrate improvements in functional tasks. Baseline: 57 Goal status: INITIAL  3.  Patient will increase left shoulder A/ROM to [facility] to allow her to place objects in overhead cabinets and put on her bra. Baseline:  Goal status: INITIAL  4.  Patient to increase left shoulder strength to at least 5-/5 to allow her to return to ability to perform yard work again. Baseline:  Goal status:  INITIAL  5.  Patient to report ability to perform yard work without increased shoulder pain. Baseline:  Goal status: INITIAL   PLAN:  PT FREQUENCY: 1-2x/week  PT DURATION: 8 weeks  PLANNED INTERVENTIONS: Therapeutic exercises, Therapeutic activity, Neuromuscular re-education, Balance training, Gait training, Patient/Family education, Self Care, Joint mobilization, Joint manipulation, Aquatic Therapy, Dry Needling, Electrical stimulation, Spinal manipulation, Spinal mobilization, Cryotherapy, Moist heat, Taping, Traction, Ultrasound, Ionotophoresis 4mg /ml Dexamethasone, Manual therapy, and Re-evaluation  PLAN FOR NEXT SESSION: Assess and progress HEP as indicated, dry needling/manual therapy as indicated, strengthening, flexibility   Reather Laurence, PT, DPT 10/18/22, 10:16 AM  [facility] [address], [address] [place], [place] [address] Phone # [phone] Fax [phone]

## 2022-10-18 NOTE — Progress Notes (Signed)
Left shoulder x-ray shows mild arthritis

## 2022-10-19 ENCOUNTER — Encounter: Payer: Self-pay | Admitting: Rehabilitative and Restorative Service Providers"

## 2022-10-19 ENCOUNTER — Ambulatory Visit: Payer: Medicare Other | Admitting: Rehabilitative and Restorative Service Providers"

## 2022-10-19 DIAGNOSIS — M6281 Muscle weakness (generalized): Secondary | ICD-10-CM

## 2022-10-19 DIAGNOSIS — G8929 Other chronic pain: Secondary | ICD-10-CM

## 2022-10-19 DIAGNOSIS — M25512 Pain in left shoulder: Secondary | ICD-10-CM | POA: Diagnosis not present

## 2022-10-19 DIAGNOSIS — R252 Cramp and spasm: Secondary | ICD-10-CM

## 2022-10-19 NOTE — Therapy (Signed)
OUTPATIENT PHYSICAL THERAPY TREATMENT NOTE   Patient Name: Cheryl Wang MRN: [id number] DOB:[birth year], 72 y.o., female Today's Date: 10/19/2022  END OF SESSION:  PT End of Session - 10/19/22 0759     Visit Number 4    Date for PT Re-Evaluation 12/03/22    Authorization Type Medicare A    Progress Note Due on Visit 10    PT Start Time 0758    PT Stop Time 0840    PT Time Calculation (min) 42 min    Activity Tolerance Patient tolerated treatment well    Behavior During Therapy [facility] for tasks assessed/performed             Past Medical History:  Diagnosis Date   Arthritis    in hands   Asthma    as a child   Chicken pox    Eczema    Hives    Medical history non-contributory    Spondylosis 11/24/2012   per xray   Wears glasses    Past Surgical History:  Procedure Laterality Date   ABDOMINAL HYSTERECTOMY  2002   VAG HYST-   CARPAL TUNNEL RELEASE Left 08/30/2012   Procedure: CARPAL TUNNEL RELEASE;  Surgeon: Nicki Reaper, MD;  Location: [facility];  Service: Orthopedics;  Laterality: Left;   COLONOSCOPY     cyst removal  10/1988   on chest    KNEE ARTHROSCOPY  1995   RIGHT AND LEFT   KNEE SURGERY     Bil   TONSILLECTOMY     TOTAL KNEE ARTHROPLASTY Left 05/13/2014   Procedure: LEFT TOTAL KNEE ARTHROPLASTY;  Surgeon: Dannielle Huh, MD;  Location: [facility];  Service: Orthopedics;  Laterality: Left;   TRIGGER FINGER RELEASE Left 08/30/2012   Procedure: STS RELEASE LEFT THUMB;  Surgeon: Nicki Reaper, MD;  Location: [facility];  Service: Orthopedics;  Laterality: Left;   Patient Active Problem List   Diagnosis Date Noted   S/P total knee arthroplasty 05/13/2014   Hx of migraines 11/24/2012   Left knee DJD 11/24/2012   Right knee DJD 11/24/2012   Obesity, unspecified 11/24/2012   Spondylosis, cervical 11/24/2012    PCP: Selina Cooley, MD  REFERRING PROVIDER: Rodolph Bong, MD  REFERRING DIAG: [phone] (ICD-10-CM) - Chronic left  shoulder pain  THERAPY DIAG:  Chronic left shoulder pain  Muscle weakness (generalized)  Cramp and spasm  Rationale for Evaluation and Treatment: Rehabilitation  ONSET DATE: September 2023 had a fall when she landed on her shoulder  SUBJECTIVE:  SUBJECTIVE STATEMENT: Pt reports her shoulder pain is "about the same; just annoying"  Hand dominance: Right  PERTINENT HISTORY: Bilateral TKA, hysterectomy, left wrist Fx  PAIN:  Are you having pain? Yes: NPRS scale: 3/10 Pain location: left shoulder Pain description: aching Aggravating factors: planks, reaching behind back, overhead exercises Relieving factors: rest, and hanging arm down, distraction  PRECAUTIONS: None  RED FLAGS: None   WEIGHT BEARING RESTRICTIONS: No  FALLS:  Has patient fallen in last 6 months? Yes. 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  NEXT MD VISIT: Dr Denyse Amass November 16, 2022  OBJECTIVE:   DIAGNOSTIC FINDINGS:  Left shoulder radiograph on 10/05/2022: Per Dr Logan Bores:  Mild to moderate glenohumeral DJD.  Mild AC DJD.  No acute fractures.  Awaiting formal review.  PATIENT SURVEYS:  Eval:  FOTO 57 (projected 66 by visit 10)  COGNITION: Overall cognitive status: Within functional limits for tasks assessed     SENSATION: WFL  POSTURE: Rounded shoulder and slight forward flexed posture with ambulation  UPPER  EXTREMITY ROM:   Active ROM Right eval Left eval  Shoulder flexion 146 128  Shoulder extension 50 48  Shoulder abduction 145 132  Shoulder adduction    Shoulder internal rotation To mid thoracic To hip with pain  Shoulder external rotation    Elbow flexion    Elbow extension    Wrist flexion    Wrist extension    Wrist ulnar deviation    Wrist radial deviation    Wrist pronation    Wrist supination    (Blank rows = not tested)  UPPER EXTREMITY MMT:  MMT Right eval Left eval  Shoulder flexion 5- 4-  Shoulder extension    Shoulder abduction 5- 4  Shoulder adduction    Shoulder internal rotation 5 4+  Shoulder external rotation 5 4+  Middle trapezius    Lower trapezius    Elbow flexion    Elbow extension    Wrist flexion    Wrist extension    Wrist ulnar deviation    Wrist radial deviation    Wrist pronation    Wrist supination    Grip strength (lbs)    (Blank rows = not tested)  SHOULDER SPECIAL TESTS: Impingement tests: Hawkins/Kennedy impingement test: positive  Rotator cuff assessment: Empty can test: positive   PALPATION:  Tight bilat upper traps, tender to palpation over left shoulder   TODAY'S TREATMENT:  DATE: 10/19/2022 UBE level 1.0 x3 min each direction with PT present to discuss status Standing shoulder ER with cuing for core engagement and improved posture and red tband 2x10 Standing shoulder horizontal abduction with red tband and core engagement.  2x10 Standing wall wash for shoulder flexion and shoulder abduction 2x10 each bilat Standing facing wall lower trap lift off for "Y" and "W" 2x10 each Sit to/from stand holding 5# kettlebell:  x10 with chest press, x10 with overhead press Standing rows and shoulder extension with green tband 2x10 each Supine/hooklying with TA activation shoulder flexion with 2# 2x10  bilat Supine/hooklying with TA activation serratus punch with 1# 2x10 bilat Right side-lying performing left shoulder abduction 2x10 Right side-lying performing left shoulder ER with 2# 2x10 Child's Pose x20 sec Quadruped alt UE/LE extension (bird dog) 2x5 Seated TA contraction with hands pressing through ball x10   DATE: 10/18/2022 UBE level 1.0 x3 min each direction with PT present to discuss status Standing shoulder ER with cuing for core engagement and improved posture and red tband 2x10 Standing shoulder horizontal abduction with red tband and core engagement.  2x10 Seated TA contraction with hands pressing through ball 2x10 Standing wall wash for shoulder flexion and shoulder abduction 2x10 each bilat Standing facing wall lower trap lift off 2x10 Standing 4D scapular stabilization on wall with 2# light blue ball x20 bilat Quadruped alt UE/LE extension (bird dog) 2x5 Child's Pose x20 sec Supine/hooklying with TA activation shoulder flexion with 1# 2x10 bilat Supine/hooklying with TA activation serratus punch with 1# 2x10   DATE: 10/11/2022 UBE level 1.0 x3 min each direction with PT present to discuss status Standing scapular retraction 2x10 Standing rows and shoulder extension with red tband 2x10 each Standing shoulder ER with cuing for core engagement and improved posture and red tband 2x10 Standing shoulder horizontal abduction with red tband and core engagement.  2x10 Seated TA contraction with hands pressing through ball 2x10 Quadruped alt UE/LE extension (bird dog) 2x5 Standing wall wash for shoulder flexion 2x10 bilat Standing facing wall lower trap lift off 2x10 Barre push-ups 2x10 Standing 4D scapular stabilization on wall with 2# light blue ball x20 bilat Supine/hooklying with TA activation shoulder flexion  Cold Pack to left shoulder x5 min    PATIENT EDUCATION: Education details: Issued HEP Person educated: Patient Education method: Explanation,  Demonstration, and Handouts Education comprehension: verbalized understanding and returned demonstration  HOME EXERCISE PROGRAM: Access Code: [email or web address] URL: [email or web address] Date: 10/08/2022 Prepared by: Clydie Braun Brynn Reznik  Exercises - Seated Scapular Retraction  - 1 x daily - 7 x weekly - 2 sets - 10 reps - Shoulder External Rotation and Scapular Retraction with Resistance  - 1 x daily - 7 x weekly - 2 sets - 10 reps - Standing Shoulder Horizontal Abduction with Resistance  - 1 x daily - 7 x weekly - 2 sets - 10 reps - Seated Upper Trapezius Stretch  - 1 x daily - 7 x weekly - 2 reps - 20 sec hold - Shoulder Flexion Wall Slide with Towel  - 1 x daily - 7 x weekly - 2 sets - 10 reps - Standing Shoulder Abduction Slides at Wall  - 1 x daily - 7 x weekly - 2 sets - 10 reps  ASSESSMENT:  CLINICAL IMPRESSION: Ms Burbank presents to skilled PT reporting that she did not have increased pain following session yesterday.  Patient with good participation with PT and HEP.  Patient continues to require cuing for core engagement during standing  exercises to improve posture.  Patient with some pain with side-lying left shoulder abduction, so no weight added to that exercise.  Patient continues to progress well with goal related activities and reporting an overall improvement in symptoms.  OBJECTIVE IMPAIRMENTS: decreased ROM, decreased strength, increased muscle spasms, impaired flexibility, postural dysfunction, and pain.   ACTIVITY LIMITATIONS: carrying, lifting, dressing, and reach over head  PARTICIPATION LIMITATIONS: cleaning, laundry, driving, community activity, and yard work  PERSONAL FACTORS: Time since onset of injury/illness/exacerbation and 1 comorbidity: OA  are also affecting patient's functional outcome.   REHAB POTENTIAL: Good  CLINICAL DECISION MAKING: Stable/uncomplicated  EVALUATION COMPLEXITY: Low   GOALS: Goals reviewed with patient? Yes  SHORT TERM GOALS:  Target date: 10/22/2022  Pt will be independent with initial HEP. Baseline: Goal status: MET on 10/18/2022  2.  Pt will report at least 25% improvement in symptoms since starting skilled PT. Baseline:  Goal status: MET on 10/18/2022   LONG TERM GOALS: Target date: 11/26/2022  Pt will be independent with advanced HEP to allow for self-progression post discharge. Baseline:  Goal status: INITIAL  2.  Patient will increase FOTO to at least 66 to demonstrate improvements in functional tasks. Baseline: 57 Goal status: INITIAL  3.  Patient will increase left shoulder A/ROM to [employer] to allow her to place objects in overhead cabinets and put on her bra. Baseline:  Goal status: INITIAL  4.  Patient to increase left shoulder strength to at least 5-/5 to allow her to return to ability to perform yard work again. Baseline:  Goal status: INITIAL  5.  Patient to report ability to perform yard work without increased shoulder pain. Baseline:  Goal status: INITIAL   PLAN:  PT FREQUENCY: 1-2x/week  PT DURATION: 8 weeks  PLANNED INTERVENTIONS: Therapeutic exercises, Therapeutic activity, Neuromuscular re-education, Balance training, Gait training, Patient/Family education, Self Care, Joint mobilization, Joint manipulation, Aquatic Therapy, Dry Needling, Electrical stimulation, Spinal manipulation, Spinal mobilization, Cryotherapy, Moist heat, Taping, Traction, Ultrasound, Ionotophoresis 4mg /ml Dexamethasone, Manual therapy, and Re-evaluation  PLAN FOR NEXT SESSION: Assess and progress HEP as indicated, dry needling/manual therapy as indicated, strengthening, flexibility   Reather Laurence, PT, DPT 10/19/22, 8:44 AM  [facility] [address] [place], [place] [address] Phone # [phone] Fax [phone]

## 2022-10-20 ENCOUNTER — Ambulatory Visit: Payer: Medicare Other | Admitting: Physical Therapy

## 2022-10-28 ENCOUNTER — Ambulatory Visit: Payer: Medicare Other

## 2022-10-28 DIAGNOSIS — M25512 Pain in left shoulder: Secondary | ICD-10-CM | POA: Diagnosis not present

## 2022-10-28 DIAGNOSIS — R252 Cramp and spasm: Secondary | ICD-10-CM

## 2022-10-28 DIAGNOSIS — M6281 Muscle weakness (generalized): Secondary | ICD-10-CM

## 2022-10-28 DIAGNOSIS — G8929 Other chronic pain: Secondary | ICD-10-CM

## 2022-10-28 NOTE — Therapy (Signed)
OUTPATIENT PHYSICAL THERAPY TREATMENT NOTE   Patient Name: Cheryl Wang MRN: [id number] DOB:[birth year], 72 y.o., female Today's Date: 10/28/2022  END OF SESSION:  PT End of Session - 10/28/22 1150     Visit Number 5    Date for PT Re-Evaluation 12/03/22    Authorization Type Medicare A    Progress Note Due on Visit 10    PT Start Time 1103    PT Stop Time 1147    PT Time Calculation (min) 44 min    Activity Tolerance Patient tolerated treatment well    Behavior During Therapy WFL for tasks assessed/performed              Past Medical History:  Diagnosis Date   Arthritis    in hands   Asthma    as a child   Chicken pox    Eczema    Hives    Medical history non-contributory    Spondylosis 11/24/2012   per xray   Wears glasses    Past Surgical History:  Procedure Laterality Date   ABDOMINAL HYSTERECTOMY  2002   VAG HYST-   CARPAL TUNNEL RELEASE Left 08/30/2012   Procedure: CARPAL TUNNEL RELEASE;  Surgeon: Nicki Reaper, MD;  Location: [facility];  Service: Orthopedics;  Laterality: Left;   COLONOSCOPY     cyst removal  10/1988   on chest    KNEE ARTHROSCOPY  1995   RIGHT AND LEFT   KNEE SURGERY     Bil   TONSILLECTOMY     TOTAL KNEE ARTHROPLASTY Left 05/13/2014   Procedure: LEFT TOTAL KNEE ARTHROPLASTY;  Surgeon: Dannielle Huh, MD;  Location: [facility];  Service: Orthopedics;  Laterality: Left;   TRIGGER FINGER RELEASE Left 08/30/2012   Procedure: STS RELEASE LEFT THUMB;  Surgeon: Nicki Reaper, MD;  Location: [facility];  Service: Orthopedics;  Laterality: Left;   Patient Active Problem List   Diagnosis Date Noted   S/P total knee arthroplasty 05/13/2014   Hx of migraines 11/24/2012   Left knee DJD 11/24/2012   Right knee DJD 11/24/2012   Obesity, unspecified 11/24/2012   Spondylosis, cervical 11/24/2012    PCP: Selina Cooley, MD  REFERRING PROVIDER: Rodolph Bong, MD  REFERRING DIAG: [phone] (ICD-10-CM) - Chronic left  shoulder pain  THERAPY DIAG:  Chronic left shoulder pain  Cramp and spasm  Muscle weakness (generalized)  Rationale for Evaluation and Treatment: Rehabilitation  ONSET DATE: September 2023 had a fall when she landed on her shoulder  SUBJECTIVE:  SUBJECTIVE STATEMENT: I was out of town and my allergies were bothering me.  I sneezed so hard and hurt my Lt shoulder. I am in a flare-up from my trip  Hand dominance: Right  PERTINENT HISTORY: Bilateral TKA, hysterectomy, left wrist Fx  PAIN:  Are you having pain? Yes: NPRS scale: 7/10 Pain location: left shoulder Pain description: aching Aggravating factors: planks, reaching behind back, overhead exercises Relieving factors: rest, and hanging arm down, distraction  PRECAUTIONS: None  RED FLAGS: None   WEIGHT BEARING RESTRICTIONS: No  FALLS:  Has patient fallen in last 6 months? Yes. 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  NEXT MD VISIT: Dr Denyse Amass November 16, 2022  OBJECTIVE:   DIAGNOSTIC FINDINGS:  Left shoulder radiograph on 10/05/2022: Per Dr Logan Bores:  Mild to moderate glenohumeral DJD.  Mild AC DJD.  No acute fractures.  Awaiting formal review.  PATIENT SURVEYS:  Eval:  FOTO 57 (projected 66 by visit 10)  COGNITION: Overall cognitive status: Within functional limits for tasks assessed     SENSATION: WFL  POSTURE: Rounded shoulder  and slight forward flexed posture with ambulation  UPPER EXTREMITY ROM:   Active ROM Right eval Left eval  Shoulder flexion 146 128  Shoulder extension 50 48  Shoulder abduction 145 132  Shoulder adduction    Shoulder internal rotation To mid thoracic To hip with pain  Shoulder external rotation    Elbow flexion    Elbow extension    Wrist flexion    Wrist extension    Wrist ulnar deviation    Wrist radial deviation    Wrist pronation    Wrist supination    (Blank rows = not tested)  UPPER EXTREMITY MMT:  MMT Right eval Left eval  Shoulder flexion 5- 4-  Shoulder extension    Shoulder abduction 5- 4  Shoulder adduction    Shoulder internal rotation 5 4+  Shoulder external rotation 5 4+  Middle trapezius    Lower trapezius    Elbow flexion    Elbow extension    Wrist flexion    Wrist extension    Wrist ulnar deviation    Wrist radial deviation    Wrist pronation    Wrist supination    Grip strength (lbs)    (Blank rows = not tested)  SHOULDER SPECIAL TESTS: Impingement tests: Hawkins/Kennedy impingement test: positive  Rotator cuff assessment: Empty can test: positive   PALPATION:  Tight bilat upper traps, tender to palpation over left shoulder   TODAY'S TREATMENT:  DATE: 10/28/2022 UBE level 1.0 x3 min each direction with PT present to discuss status Standing shoulder ER with cuing for core engagement and improved posture and green tband 2x10 Standing shoulder horizontal abduction with red tband and core engagement.  2x10 Standing wall wash for shoulder flexion and shoulder abduction 2x10 each bilat  Standing rows and shoulder extension with green tband 2x10 each Trigger Point Dry-Needling  Treatment instructions: Expect mild to moderate muscle soreness. S/S of pneumothorax if dry needled over a lung field, and to seek  immediate medical attention should they occur. Patient verbalized understanding of these instructions and education.  Patient Consent Given: Yes Education handout provided: Yes Muscles treated: Lt upper trap, rhomboids, Lt thoracic and cervical multifidi Treatment response/outcome: Utilized skilled palpation to identify trigger points.  During dry needling able to palpate muscle twitch and muscle elongation  Elongation and release s/p DN Skilled palpation and monitoring by PT during dry needling t   DATE: 10/19/2022 UBE level 1.0 x3 min each direction with PT present to discuss status Standing shoulder ER with cuing for core engagement and improved posture and red tband 2x10 Standing shoulder horizontal abduction with red tband and core engagement.  2x10 Standing wall wash for shoulder flexion and shoulder abduction 2x10 each bilat Standing facing wall lower trap lift off for "Y" and "W" 2x10 each Sit to/from stand holding 5# kettlebell:  x10 with chest press, x10 with overhead press Standing rows and shoulder extension with green tband 2x10 each Supine/hooklying with TA activation shoulder flexion with 2# 2x10 bilat Supine/hooklying with TA activation serratus punch with 1# 2x10 bilat Right side-lying performing left shoulder abduction 2x10 Right side-lying performing left shoulder ER with 2# 2x10 Child's Pose x20 sec Quadruped alt UE/LE extension (bird dog) 2x5 Seated TA contraction with hands pressing through ball x10   DATE: 10/18/2022 UBE level 1.0 x3 min each direction with PT present to discuss status Standing shoulder ER with cuing for core engagement and improved posture and red tband 2x10 Standing shoulder horizontal abduction with red tband and core engagement.  2x10 Seated TA contraction with hands pressing through ball 2x10 Standing wall wash for shoulder flexion and shoulder abduction 2x10 each bilat Standing facing wall lower trap lift off 2x10 Standing 4D scapular  stabilization on wall with 2# light blue ball x20 bilat Quadruped alt UE/LE extension (bird dog) 2x5 Child's Pose x20 sec Supine/hooklying with TA activation shoulder flexion with 1# 2x10 bilat Supine/hooklying with TA activation serratus punch with 1# 2x10   PATIENT EDUCATION: Education details: Issued HEP Person educated: Patient Education method: Explanation, Demonstration, and Handouts Education comprehension: verbalized understanding and returned demonstration  HOME EXERCISE PROGRAM: Access Code: [email or web address] URL: [email or web address] Date: 10/08/2022 Prepared by: Clydie Braun Menke  Exercises - Seated Scapular Retraction  - 1 x daily - 7 x weekly - 2 sets - 10 reps - Shoulder External Rotation and Scapular Retraction with Resistance  - 1 x daily - 7 x weekly - 2 sets - 10 reps - Standing Shoulder Horizontal Abduction with Resistance  - 1 x daily - 7 x weekly - 2 sets - 10 reps - Seated Upper Trapezius Stretch  - 1 x daily - 7 x weekly - 2 reps - 20 sec hold - Shoulder Flexion Wall Slide with Towel  - 1 x daily - 7 x weekly - 2 sets - 10 reps - Standing Shoulder Abduction Slides at Wall  - 1 x daily - 7 x weekly - 2 sets - 10 reps  ASSESSMENT:  CLINICAL IMPRESSION: Prior to trip to [place], pt reports that Lt shoulder was significantly improved.  Pt arrived with 7/10 Lt shoulder pain and cervicogenic headache.  Pt was able to participate in exercises and agreed to DN to address trigger points and tension.  Pt had good response to DN with multiple trigger points, twitch response and improved tissue mobility after.  She reported reduced pain at end of session.   Patient continues to progress well with goal related activities and reporting an overall improvement in symptoms.  OBJECTIVE IMPAIRMENTS: decreased ROM, decreased strength, increased muscle spasms, impaired flexibility, postural dysfunction, and pain.   ACTIVITY LIMITATIONS: carrying, lifting, dressing, and reach over  head  PARTICIPATION LIMITATIONS: cleaning, laundry, driving, community activity, and yard work  PERSONAL FACTORS: Time since onset of injury/illness/exacerbation and 1 comorbidity: OA  are also affecting patient's functional outcome.   REHAB POTENTIAL: Good  CLINICAL DECISION MAKING: Stable/uncomplicated  EVALUATION COMPLEXITY: Low   GOALS: Goals reviewed with patient? Yes  SHORT TERM GOALS: Target date: 10/22/2022  Pt will be independent with initial HEP. Baseline: Goal status: MET on 10/18/2022  2.  Pt will report at least 25% improvement in symptoms since starting skilled PT. Baseline:  Goal status: MET on 10/18/2022   LONG TERM GOALS: Target date: 11/26/2022  Pt will be independent with advanced HEP to allow for self-progression post discharge. Baseline:  Goal status: INITIAL  2.  Patient will increase FOTO to at least 66 to demonstrate improvements in functional tasks. Baseline: 57 Goal status: INITIAL  3.  Patient will increase left shoulder A/ROM to [facility] to allow her to place objects in overhead cabinets and put on her bra. Baseline:  Goal status: INITIAL  4.  Patient to increase left shoulder strength to at least 5-/5 to allow her to return to ability to perform yard work again. Baseline:  Goal status: INITIAL  5.  Patient to report ability to perform yard work without increased shoulder pain. Baseline:  Goal status: INITIAL   PLAN:  PT FREQUENCY: 1-2x/week  PT DURATION: 8 weeks  PLANNED INTERVENTIONS: Therapeutic exercises, Therapeutic activity, Neuromuscular re-education, Balance training, Gait training, Patient/Family education, Self Care, Joint mobilization, Joint manipulation, Aquatic Therapy, Dry Needling, Electrical stimulation, Spinal manipulation, Spinal mobilization, Cryotherapy, Moist heat, Taping, Traction, Ultrasound, Ionotophoresis 4mg /ml Dexamethasone, Manual therapy, and Re-evaluation  PLAN FOR NEXT SESSION: assess response to DN, dry  needling/manual therapy as indicated, strengthening, flexibility   Lorrene Reid, PT 10/28/22 11:55 AM   [facility] [address] [place], [place] [address] Phone # [phone] Fax [phone]

## 2022-11-03 ENCOUNTER — Ambulatory Visit: Payer: Medicare Other | Attending: Family Medicine

## 2022-11-03 DIAGNOSIS — M6281 Muscle weakness (generalized): Secondary | ICD-10-CM

## 2022-11-03 DIAGNOSIS — G8929 Other chronic pain: Secondary | ICD-10-CM

## 2022-11-03 DIAGNOSIS — M25512 Pain in left shoulder: Secondary | ICD-10-CM | POA: Diagnosis present

## 2022-11-03 DIAGNOSIS — R293 Abnormal posture: Secondary | ICD-10-CM | POA: Diagnosis present

## 2022-11-03 DIAGNOSIS — R252 Cramp and spasm: Secondary | ICD-10-CM

## 2022-11-03 NOTE — Therapy (Signed)
OUTPATIENT PHYSICAL THERAPY TREATMENT NOTE   Patient Name: Cheryl Wang MRN: [id number] DOB:1951-01-01, 72 y.o., female Today's Date: 11/03/2022  END OF SESSION:  PT End of Session - 11/03/22 1022     Visit Number 6    Date for PT Re-Evaluation 12/03/22    Authorization Type Medicare A    PT Start Time 1022    PT Stop Time 1100    PT Time Calculation (min) 38 min    Activity Tolerance Patient tolerated treatment well    Behavior During Therapy WFL for tasks assessed/performed              Past Medical History:  Diagnosis Date   Arthritis    in hands   Asthma    as a child   Chicken pox    Eczema    Hives    Medical history non-contributory    Spondylosis 11/24/2012   per xray   Wears glasses    Past Surgical History:  Procedure Laterality Date   ABDOMINAL HYSTERECTOMY  2002   VAG HYST-   CARPAL TUNNEL RELEASE Left 08/30/2012   Procedure: CARPAL TUNNEL RELEASE;  Surgeon: Nicki Reaper, MD;  Location: [facility];  Service: Orthopedics;  Laterality: Left;   COLONOSCOPY     cyst removal  10/1988   on chest    KNEE ARTHROSCOPY  1995   RIGHT AND LEFT   KNEE SURGERY     Bil   TONSILLECTOMY     TOTAL KNEE ARTHROPLASTY Left 05/13/2014   Procedure: LEFT TOTAL KNEE ARTHROPLASTY;  Surgeon: Dannielle Huh, MD;  Location: [facility];  Service: Orthopedics;  Laterality: Left;   TRIGGER FINGER RELEASE Left 08/30/2012   Procedure: STS RELEASE LEFT THUMB;  Surgeon: Nicki Reaper, MD;  Location: [facility];  Service: Orthopedics;  Laterality: Left;   Patient Active Problem List   Diagnosis Date Noted   S/P total knee arthroplasty 05/13/2014   Hx of migraines 11/24/2012   Left knee DJD 11/24/2012   Right knee DJD 11/24/2012   Obesity, unspecified 11/24/2012   Spondylosis, cervical 11/24/2012    PCP: Selina Cooley, MD  REFERRING PROVIDER: Rodolph Bong, MD  REFERRING DIAG: [phone] (ICD-10-CM) - Chronic left shoulder pain  THERAPY DIAG:   Chronic left shoulder pain  Cramp and spasm  Muscle weakness (generalized)  Abnormal posture  Rationale for Evaluation and Treatment: Rehabilitation  ONSET DATE: September 2023 had a fall when she landed on her shoulder  SUBJECTIVE:  SUBJECTIVE STATEMENT: Patient states left shoulder is tolerable but still hurts with lifting above shoulder level.  She states she can do most anything waist level and down but still gets pain with overhead reach.   Hand dominance: Right  PERTINENT HISTORY: Bilateral TKA, hysterectomy, left wrist Fx  PAIN:  Are you having pain? Yes: NPRS scale: 7/10 Pain location: left shoulder Pain description: aching Aggravating factors: planks, reaching behind back, overhead exercises Relieving factors: rest, and hanging arm down, distraction  PRECAUTIONS: None  RED FLAGS: None   WEIGHT BEARING RESTRICTIONS: No  FALLS:  Has patient fallen in last 6 months? Yes. 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  NEXT MD VISIT: Dr Denyse Amass November 16, 2022  OBJECTIVE:   DIAGNOSTIC FINDINGS:  Left shoulder radiograph on 10/05/2022: Per Dr Logan Bores:  Mild to moderate glenohumeral DJD.  Mild AC DJD.  No acute fractures.  Awaiting formal review.  PATIENT SURVEYS:  Eval:  FOTO 57 (projected 66 by visit 10)  COGNITION: Overall cognitive status: Within functional limits for tasks  assessed     SENSATION: WFL  POSTURE: Rounded shoulder and slight forward flexed posture with ambulation  UPPER EXTREMITY ROM:   Active ROM Right eval Left eval  Shoulder flexion 146 128  Shoulder extension 50 48  Shoulder abduction 145 132  Shoulder adduction    Shoulder internal rotation To mid thoracic To hip with pain  Shoulder external rotation    Elbow flexion    Elbow extension    Wrist flexion    Wrist extension    Wrist ulnar deviation    Wrist radial deviation    Wrist pronation    Wrist supination    (Blank rows = not tested)  UPPER EXTREMITY MMT:  MMT Right eval Left eval  Shoulder flexion 5- 4-  Shoulder extension    Shoulder abduction 5- 4  Shoulder adduction    Shoulder internal rotation 5 4+  Shoulder external rotation 5 4+  Middle trapezius    Lower trapezius    Elbow flexion    Elbow extension    Wrist flexion    Wrist extension    Wrist ulnar deviation    Wrist radial deviation    Wrist pronation    Wrist supination    Grip strength (lbs)    (Blank rows = not tested)  SHOULDER SPECIAL TESTS: Impingement tests: Hawkins/Kennedy impingement test: positive  Rotator cuff assessment: Empty can test: positive   PALPATION:  Tight bilat upper traps, tender to palpation over left shoulder   TODAY'S TREATMENT:  DATE: 11/03/2022 UBE level 1.0 6 min x3 min each direction with PT present to discuss status 3 way scapular stabilization with blue loop x 10 each side 4D ball rolls x 20 each direction, left Prone shoulder extension, row and horizontal abduction 2 x 10 each with 2 lbs left Side lying ER with 2 lb 2 x 10 left Supine serratus punch x 20 with 2 lbs left Trigger Point Dry-Needling  Treatment instructions: Expect mild to moderate muscle soreness. S/S of pneumothorax if dry needled over a lung field, and  to seek immediate medical attention should they occur. Patient verbalized understanding of these instructions and education. Patient Consent Given: Yes Education handout provided: Yes Muscles treated: Lt upper trap, rhomboids, Lt thoracic and cervical multifidi Treatment response/outcome: Utilized skilled palpation to identify trigger points.  During dry needling able to palpate muscle twitch and muscle elongation  Elongation and release s/p DN Skilled palpation and monitoring by PT during dry needling   DATE: 10/28/2022 UBE level 1.0 x3 min each direction with PT present to discuss status Standing shoulder ER with cuing for core engagement and improved posture and green tband 2x10 Standing shoulder horizontal abduction with red tband and core engagement.  2x10 Standing wall wash for shoulder flexion and shoulder abduction 2x10 each bilat  Standing rows and shoulder extension with green tband 2x10 each Trigger Point Dry-Needling  Treatment instructions: Expect mild to moderate muscle soreness. S/S of pneumothorax if dry needled over a lung field, and to seek immediate medical attention should they occur. Patient verbalized understanding of these instructions and education.  Patient Consent Given: Yes Education handout provided: Yes Muscles treated: Lt upper trap, rhomboids, Lt thoracic and cervical multifidi Treatment response/outcome: Utilized skilled palpation to identify trigger points.  During dry needling able to palpate muscle twitch and muscle elongation  Elongation and release s/p DN Skilled palpation and monitoring by PT during dry needling t   DATE: 10/19/2022 UBE level 1.0 x3 min each direction with PT present to discuss status Standing shoulder ER with cuing for core engagement and improved posture and red tband 2x10 Standing shoulder horizontal abduction with red tband and core engagement.  2x10 Standing wall wash for shoulder flexion and shoulder abduction 2x10 each  bilat Standing facing wall lower trap lift off for "Y" and "W" 2x10 each Sit to/from stand holding 5# kettlebell:  x10 with chest press, x10 with overhead press Standing rows and shoulder extension with green tband 2x10 each Supine/hooklying with TA activation shoulder flexion with 2# 2x10 bilat Supine/hooklying with TA activation serratus punch with 1# 2x10 bilat Right side-lying performing left shoulder abduction 2x10 Right side-lying performing left shoulder ER with 2# 2x10 Child's Pose x20 sec Quadruped alt UE/LE extension (bird dog) 2x5 Seated TA contraction with hands pressing through ball x10  PATIENT EDUCATION: Education details: Issued HEP Person educated: Patient Education method: Explanation, Demonstration, and Handouts Education comprehension: verbalized understanding and returned demonstration  HOME EXERCISE PROGRAM: Access Code: [email or web address] URL: [email or web address] Date: 10/08/2022 Prepared by: Reather Laurence  Exercises - Seated Scapular Retraction  - 1 x daily - 7 x weekly - 2 sets - 10 reps - Shoulder External Rotation and Scapular Retraction with Resistance  - 1 x daily - 7 x weekly - 2 sets - 10 reps - Standing Shoulder Horizontal Abduction with Resistance  - 1 x daily - 7 x weekly - 2 sets - 10 reps - Seated Upper Trapezius Stretch  - 1 x daily - 7 x weekly - 2  reps - 20 sec hold - Shoulder Flexion Wall Slide with Towel  - 1 x daily - 7 x weekly - 2 sets - 10 reps - Standing Shoulder Abduction Slides at Wall  - 1 x daily - 7 x weekly - 2 sets - 10 reps  ASSESSMENT:  CLINICAL IMPRESSION: Vassiliki is progressing appropriately.  She continues to have some impingement symptoms but this is slowly improving.  She is able to do most activity below 90 degrees of flexion with minimal pain.  She completed all scapular stabilization activities today without pain with good tolerance to the 2 lbs dumbbell.  She could likely do 3# next visit.  She is compliant and well  motivated.  She would benefit from continued skilled PT for left shoulder and postural strengthening along with DN to reduce trigger points and spasm.    OBJECTIVE IMPAIRMENTS: decreased ROM, decreased strength, increased muscle spasms, impaired flexibility, postural dysfunction, and pain.   ACTIVITY LIMITATIONS: carrying, lifting, dressing, and reach over head  PARTICIPATION LIMITATIONS: cleaning, laundry, driving, community activity, and yard work  PERSONAL FACTORS: Time since onset of injury/illness/exacerbation and 1 comorbidity: OA  are also affecting patient's functional outcome.   REHAB POTENTIAL: Good  CLINICAL DECISION MAKING: Stable/uncomplicated  EVALUATION COMPLEXITY: Low   GOALS: Goals reviewed with patient? Yes  SHORT TERM GOALS: Target date: 10/22/2022  Pt will be independent with initial HEP. Baseline: Goal status: MET on 10/18/2022  2.  Pt will report at least 25% improvement in symptoms since starting skilled PT. Baseline:  Goal status: MET on 10/18/2022   LONG TERM GOALS: Target date: 11/26/2022  Pt will be independent with advanced HEP to allow for self-progression post discharge. Baseline:  Goal status: INITIAL  2.  Patient will increase FOTO to at least 66 to demonstrate improvements in functional tasks. Baseline: 57 Goal status: INITIAL  3.  Patient will increase left shoulder A/ROM to [facility] to allow her to place objects in overhead cabinets and put on her bra. Baseline:  Goal status: INITIAL  4.  Patient to increase left shoulder strength to at least 5-/5 to allow her to return to ability to perform yard work again. Baseline:  Goal status: INITIAL  5.  Patient to report ability to perform yard work without increased shoulder pain. Baseline:  Goal status: INITIAL   PLAN:  PT FREQUENCY: 1-2x/week  PT DURATION: 8 weeks  PLANNED INTERVENTIONS: Therapeutic exercises, Therapeutic activity, Neuromuscular re-education, Balance training, Gait  training, Patient/Family education, Self Care, Joint mobilization, Joint manipulation, Aquatic Therapy, Dry Needling, Electrical stimulation, Spinal manipulation, Spinal mobilization, Cryotherapy, Moist heat, Taping, Traction, Ultrasound, Ionotophoresis 4mg /ml Dexamethasone, Manual therapy, and Re-evaluation  PLAN FOR NEXT SESSION: UBE, shoulder strengthening and scapular stabilization, assess response to DN, dry needling/manual therapy as indicated, strengthening, flexibility   Kc Sedlak B. Tyia Binford, PT 11/03/22 1:14 PM  [facility] [address], [address] [place], [place] [address] Phone # [phone] Fax [phone]

## 2022-11-04 ENCOUNTER — Ambulatory Visit: Payer: Medicare Other

## 2022-11-08 ENCOUNTER — Ambulatory Visit: Payer: Medicare Other | Admitting: Rehabilitative and Restorative Service Providers"

## 2022-11-08 ENCOUNTER — Encounter: Payer: Self-pay | Admitting: Rehabilitative and Restorative Service Providers"

## 2022-11-08 DIAGNOSIS — R293 Abnormal posture: Secondary | ICD-10-CM

## 2022-11-08 DIAGNOSIS — R252 Cramp and spasm: Secondary | ICD-10-CM

## 2022-11-08 DIAGNOSIS — G8929 Other chronic pain: Secondary | ICD-10-CM

## 2022-11-08 DIAGNOSIS — M6281 Muscle weakness (generalized): Secondary | ICD-10-CM

## 2022-11-08 NOTE — Therapy (Signed)
OUTPATIENT PHYSICAL THERAPY TREATMENT NOTE   Patient Name: Cheryl Wang MRN: [id number] DOB:[birth year], 72 y.o., female Today's Date: 11/08/2022  END OF SESSION:  PT End of Session - 11/08/22 1100     Visit Number 7    Date for PT Re-Evaluation 12/03/22    Authorization Type Medicare A    Progress Note Due on Visit 10    PT Start Time 1059    PT Stop Time 1140    PT Time Calculation (min) 41 min    Activity Tolerance Patient tolerated treatment well    Behavior During Therapy WFL for tasks assessed/performed              Past Medical History:  Diagnosis Date   Arthritis    in hands   Asthma    as a child   Chicken pox    Eczema    Hives    Medical history non-contributory    Spondylosis 11/24/2012   per xray   Wears glasses    Past Surgical History:  Procedure Laterality Date   ABDOMINAL HYSTERECTOMY  2002   VAG HYST-   CARPAL TUNNEL RELEASE Left 08/30/2012   Procedure: CARPAL TUNNEL RELEASE;  Surgeon: Nicki Reaper, MD;  Location: [facility];  Service: Orthopedics;  Laterality: Left;   COLONOSCOPY     cyst removal  10/1988   on chest    KNEE ARTHROSCOPY  1995   RIGHT AND LEFT   KNEE SURGERY     Bil   TONSILLECTOMY     TOTAL KNEE ARTHROPLASTY Left 05/13/2014   Procedure: LEFT TOTAL KNEE ARTHROPLASTY;  Surgeon: Dannielle Huh, MD;  Location: [facility];  Service: Orthopedics;  Laterality: Left;   TRIGGER FINGER RELEASE Left 08/30/2012   Procedure: STS RELEASE LEFT THUMB;  Surgeon: Nicki Reaper, MD;  Location: [facility];  Service: Orthopedics;  Laterality: Left;   Patient Active Problem List   Diagnosis Date Noted   S/P total knee arthroplasty 05/13/2014   Hx of migraines 11/24/2012   Left knee DJD 11/24/2012   Right knee DJD 11/24/2012   Obesity, unspecified 11/24/2012   Spondylosis, cervical 11/24/2012    PCP: Selina Cooley, MD  REFERRING PROVIDER: Rodolph Bong, MD  REFERRING DIAG: [phone] (ICD-10-CM) - Chronic left  shoulder pain  THERAPY DIAG:  Chronic left shoulder pain  Cramp and spasm  Muscle weakness (generalized)  Abnormal posture  Rationale for Evaluation and Treatment: Rehabilitation  ONSET DATE: September 2023 had a fall when she landed on her shoulder  SUBJECTIVE:  SUBJECTIVE STATEMENT: Patient reports that she had increased shoulder pain after doing weed eating, but states that she is doing better now.  Hand dominance: Right  PERTINENT HISTORY: Bilateral TKA, hysterectomy, left wrist Fx  PAIN:  Are you having pain? Yes: NPRS scale: currently 0/10 Pain location: left shoulder Pain description: aching Aggravating factors: planks, reaching behind back, overhead exercises Relieving factors: rest, and hanging arm down, distraction  PRECAUTIONS: None  RED FLAGS: None   WEIGHT BEARING RESTRICTIONS: No  FALLS:  Has patient fallen in last 6 months? Yes. 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  NEXT MD VISIT: Dr Denyse Amass November 16, 2022  OBJECTIVE:   DIAGNOSTIC FINDINGS:  Left shoulder radiograph on 10/05/2022: Per Dr Logan Bores:  Mild to moderate glenohumeral DJD.  Mild AC DJD.  No acute fractures.  Awaiting formal review.  PATIENT SURVEYS:  Eval:  FOTO 57 (projected 66 by visit 10)  COGNITION: Overall cognitive status: Within functional limits for tasks  assessed     SENSATION: WFL  POSTURE: Rounded shoulder and slight forward flexed posture with ambulation  UPPER EXTREMITY ROM:   Active ROM Right eval Left eval  Shoulder flexion 146 128  Shoulder extension 50 48  Shoulder abduction 145 132  Shoulder adduction    Shoulder internal rotation To mid thoracic To hip with pain  Shoulder external rotation    Elbow flexion    Elbow extension    Wrist flexion    Wrist extension    Wrist ulnar deviation    Wrist radial deviation    Wrist pronation    Wrist supination    (Blank rows = not tested)  UPPER EXTREMITY MMT:  MMT Right eval Left eval  Shoulder flexion 5- 4-  Shoulder extension    Shoulder abduction 5- 4  Shoulder adduction    Shoulder internal rotation 5 4+  Shoulder external rotation 5 4+  Middle trapezius    Lower trapezius    Elbow flexion    Elbow extension    Wrist flexion    Wrist extension    Wrist ulnar deviation    Wrist radial deviation    Wrist pronation    Wrist supination    Grip strength (lbs)    (Blank rows = not tested)  SHOULDER SPECIAL TESTS: Impingement tests: Hawkins/Kennedy impingement test: positive  Rotator cuff assessment: Empty can test: positive   PALPATION:  Tight bilat upper traps, tender to palpation over left shoulder   TODAY'S TREATMENT:  DATE: 11/08/2022 UBE level 1.2 x3 min each direction with PT present to discuss status 3 way scapular stabilization with blue loop 2x5 bilat 4D ball rolls x 20 each direction bilat L shoulder UE ranger with 1# on left UE  Supine serratus punch x 20 with 3 lbs left Supine shoulder Alphabet A-Z with 3 lbs LUE Bent forward rows, shoulder horizontal abduction, and shoulder extension with 3# 2x10 each LUE Trigger Point Dry-Needling  Treatment instructions: Expect mild to moderate muscle soreness. S/S of  pneumothorax if dry needled over a lung field, and to seek immediate medical attention should they occur. Patient verbalized understanding of these instructions and education. Patient Consent Given: Yes Education handout provided: Previously provided Muscles treated: bilat upper traps, left cervical and thoracic multifidi, left rhomboids Electrical stimulation performed: No Parameters: N/A Treatment response/outcome: Utilized skilled palpation to identify bony landmarks and trigger points.  Able to illicit twitch response and muscle elongation.  Soft tissue mobilization following to further promote tissue elongation.      DATE: 11/03/2022 UBE level 1.0 6 min x3 min each direction with PT present to discuss status 3 way scapular stabilization with blue loop x 10 each side 4D ball rolls x 20 each direction, left Prone shoulder extension, row and horizontal abduction 2 x 10 each with 2 lbs left Side lying ER with 2 lb 2 x 10 left Supine serratus punch x 20 with 2 lbs left Trigger Point Dry-Needling  Treatment instructions: Expect mild to moderate muscle soreness. S/S of pneumothorax if dry needled over a lung field, and to seek immediate medical attention should they occur. Patient verbalized understanding of these instructions and education. Patient Consent Given: Yes Education handout provided: Yes Muscles treated: Lt upper trap, rhomboids, Lt thoracic and cervical multifidi Treatment response/outcome: Utilized skilled palpation to identify trigger points.  During dry needling able to palpate muscle twitch and muscle elongation  Elongation and release s/p DN Skilled palpation and monitoring by PT during dry needling   DATE: 10/28/2022 UBE level 1.0 x3 min each direction with PT present to discuss status Standing shoulder ER with cuing for core engagement and improved posture and green tband 2x10 Standing shoulder horizontal abduction with red tband and core engagement.  2x10 Standing wall  wash for shoulder flexion and shoulder abduction 2x10 each bilat  Standing rows and shoulder extension with green tband 2x10 each Trigger Point Dry-Needling  Treatment instructions: Expect mild to moderate muscle soreness. S/S of pneumothorax if dry needled over a lung field, and to seek immediate medical attention should they occur. Patient verbalized understanding of these instructions and education.  Patient Consent Given: Yes Education handout provided: Yes Muscles treated: Lt upper trap, rhomboids, Lt thoracic and cervical multifidi Treatment response/outcome: Utilized skilled palpation to identify trigger points.  During dry needling able to palpate muscle twitch and muscle elongation  Elongation and release s/p DN Skilled palpation and monitoring by PT during dry needling t   PATIENT EDUCATION: Education details: Issued HEP Person educated: Patient Education method: Explanation, Demonstration, and Handouts Education comprehension: verbalized understanding and returned demonstration  HOME EXERCISE PROGRAM: Access Code: [email or web address] URL: [email or web address] Date: 10/08/2022 Prepared by: Reather Laurence  Exercises - Seated Scapular Retraction  - 1 x daily - 7 x weekly - 2 sets - 10 reps - Shoulder External Rotation and Scapular Retraction with Resistance  - 1 x daily - 7 x weekly - 2 sets - 10 reps - Standing Shoulder Horizontal Abduction with Resistance  - 1 x daily -  7 x weekly - 2 sets - 10 reps - Seated Upper Trapezius Stretch  - 1 x daily - 7 x weekly - 2 reps - 20 sec hold - Shoulder Flexion Wall Slide with Towel  - 1 x daily - 7 x weekly - 2 sets - 10 reps - Standing Shoulder Abduction Slides at Wall  - 1 x daily - 7 x weekly - 2 sets - 10 reps  ASSESSMENT:  CLINICAL IMPRESSION: Ms Pellow presents to skilled PT reporting that her shoulder is feeling better overall.  States that she only had pain with her shoulder with working with weed eater.  Patient with  less muscle spasms and twitch response noted overall with dry needling, but did have response to left thoracic region.  Patient continues with overall strengthening and improved postural support and able to tolerate increased weight today.   OBJECTIVE IMPAIRMENTS: decreased ROM, decreased strength, increased muscle spasms, impaired flexibility, postural dysfunction, and pain.   ACTIVITY LIMITATIONS: carrying, lifting, dressing, and reach over head  PARTICIPATION LIMITATIONS: cleaning, laundry, driving, community activity, and yard work  PERSONAL FACTORS: Time since onset of injury/illness/exacerbation and 1 comorbidity: OA  are also affecting patient's functional outcome.   REHAB POTENTIAL: Good  CLINICAL DECISION MAKING: Stable/uncomplicated  EVALUATION COMPLEXITY: Low   GOALS: Goals reviewed with patient? Yes  SHORT TERM GOALS: Target date: 10/22/2022  Pt will be independent with initial HEP. Baseline: Goal status: MET on 10/18/2022  2.  Pt will report at least 25% improvement in symptoms since starting skilled PT. Baseline:  Goal status: MET on 10/18/2022   LONG TERM GOALS: Target date: 11/26/2022  Pt will be independent with advanced HEP to allow for self-progression post discharge. Baseline:  Goal status: Ongoing  2.  Patient will increase FOTO to at least 66 to demonstrate improvements in functional tasks. Baseline: 57 Goal status: INITIAL  3.  Patient will increase left shoulder A/ROM to [facility] to allow her to place objects in overhead cabinets and put on her bra. Baseline:  Goal status: Ongoing  4.  Patient to increase left shoulder strength to at least 5-/5 to allow her to return to ability to perform yard work again. Baseline:  Goal status: INITIAL  5.  Patient to report ability to perform yard work without increased shoulder pain. Baseline:  Goal status: INITIAL   PLAN:  PT FREQUENCY: 1-2x/week  PT DURATION: 8 weeks  PLANNED INTERVENTIONS: Therapeutic  exercises, Therapeutic activity, Neuromuscular re-education, Balance training, Gait training, Patient/Family education, Self Care, Joint mobilization, Joint manipulation, Aquatic Therapy, Dry Needling, Electrical stimulation, Spinal manipulation, Spinal mobilization, Cryotherapy, Moist heat, Taping, Traction, Ultrasound, Ionotophoresis 4mg /ml Dexamethasone, Manual therapy, and Re-evaluation  PLAN FOR NEXT SESSION: UBE, shoulder strengthening and scapular stabilization, assess response to DN, dry needling/manual therapy as indicated, strengthening, flexibility   Reather Laurence, PT, DPT 11/08/22, 12:07 PM  [facility] [address], [address] [place], [place] [address] Phone # [phone] Fax [phone]

## 2022-11-11 ENCOUNTER — Encounter: Payer: Self-pay | Admitting: Rehabilitative and Restorative Service Providers"

## 2022-11-11 ENCOUNTER — Ambulatory Visit: Payer: Medicare Other | Admitting: Rehabilitative and Restorative Service Providers"

## 2022-11-11 DIAGNOSIS — M6281 Muscle weakness (generalized): Secondary | ICD-10-CM

## 2022-11-11 DIAGNOSIS — R293 Abnormal posture: Secondary | ICD-10-CM

## 2022-11-11 DIAGNOSIS — G8929 Other chronic pain: Secondary | ICD-10-CM

## 2022-11-11 DIAGNOSIS — R252 Cramp and spasm: Secondary | ICD-10-CM

## 2022-11-11 DIAGNOSIS — M25512 Pain in left shoulder: Secondary | ICD-10-CM | POA: Diagnosis not present

## 2022-11-11 NOTE — Therapy (Signed)
OUTPATIENT PHYSICAL THERAPY TREATMENT NOTE   Patient Name: Cheryl Wang MRN: [id number] DOB:21-Feb-1951, 72 y.o., female Today's Date: 11/11/2022  END OF SESSION:  PT End of Session - 11/11/22 0930     Visit Number 8    Date for PT Re-Evaluation 12/03/22    Authorization Type Medicare A    Progress Note Due on Visit 10    PT Start Time 0928    PT Stop Time 1010    PT Time Calculation (min) 42 min    Activity Tolerance Patient tolerated treatment well    Behavior During Therapy [facility] for tasks assessed/performed              Past Medical History:  Diagnosis Date   Arthritis    in hands   Asthma    as a child   Chicken pox    Eczema    Hives    Medical history non-contributory    Spondylosis 11/24/2012   per xray   Wears glasses    Past Surgical History:  Procedure Laterality Date   ABDOMINAL HYSTERECTOMY  2002   VAG HYST-   CARPAL TUNNEL RELEASE Left 08/30/2012   Procedure: CARPAL TUNNEL RELEASE;  Surgeon: Nicki Reaper, MD;  Location: [facility];  Service: Orthopedics;  Laterality: Left;   COLONOSCOPY     cyst removal  10/1988   on chest    KNEE ARTHROSCOPY  1995   RIGHT AND LEFT   KNEE SURGERY     Bil   TONSILLECTOMY     TOTAL KNEE ARTHROPLASTY Left 05/13/2014   Procedure: LEFT TOTAL KNEE ARTHROPLASTY;  Surgeon: Dannielle Huh, MD;  Location: [facility];  Service: Orthopedics;  Laterality: Left;   TRIGGER FINGER RELEASE Left 08/30/2012   Procedure: STS RELEASE LEFT THUMB;  Surgeon: Nicki Reaper, MD;  Location: [facility];  Service: Orthopedics;  Laterality: Left;   Patient Active Problem List   Diagnosis Date Noted   S/P total knee arthroplasty 05/13/2014   Hx of migraines 11/24/2012   Left knee DJD 11/24/2012   Right knee DJD 11/24/2012   Obesity, unspecified 11/24/2012   Spondylosis, cervical 11/24/2012    PCP: Selina Cooley, MD  REFERRING PROVIDER: Rodolph Bong, MD  REFERRING DIAG: [phone] (ICD-10-CM) - Chronic left  shoulder pain  THERAPY DIAG:  Chronic left shoulder pain  Cramp and spasm  Muscle weakness (generalized)  Abnormal posture  Rationale for Evaluation and Treatment: Rehabilitation  ONSET DATE: September 2023 had a fall when she landed on her shoulder  SUBJECTIVE:  SUBJECTIVE STATEMENT: Patient reports that she felt the best she has felt after her last PT visit, states that the dry needling helped a lot.  Pt reports that she painted some yesterday.  Hand dominance: Right  PERTINENT HISTORY: Bilateral TKA, hysterectomy, left wrist Fx  PAIN:  Are you having pain? Yes: NPRS scale: 3/10 Pain location: left shoulder Pain description: aching Aggravating factors: planks, reaching behind back, overhead exercises Relieving factors: rest, and hanging arm down, distraction  PRECAUTIONS: None  RED FLAGS: None   WEIGHT BEARING RESTRICTIONS: No  FALLS:  Has patient fallen in last 6 months? Yes. 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  NEXT MD VISIT: Dr Denyse Amass November 16, 2022  OBJECTIVE:   DIAGNOSTIC FINDINGS:  Left shoulder radiograph on 10/05/2022: Per Dr Logan Bores:  Mild to moderate glenohumeral DJD.  Mild AC DJD.  No acute fractures.  Awaiting formal review.  PATIENT SURVEYS:  Eval:  FOTO 57 (projected 66 by visit 10)  COGNITION: Overall cognitive status: Within functional limits for tasks  assessed     SENSATION: WFL  POSTURE: Rounded shoulder and slight forward flexed posture with ambulation  UPPER EXTREMITY ROM:   Active ROM Right eval Left eval Left 11/11/22  Shoulder flexion 146 128 140  Shoulder extension 50 48 52  Shoulder abduction 145 132 142  Shoulder adduction     Shoulder internal rotation To mid thoracic To hip with pain To L1  Shoulder external rotation     Elbow flexion     Elbow extension     Wrist flexion     Wrist extension     Wrist ulnar deviation     Wrist radial deviation     Wrist pronation     Wrist supination     (Blank rows = not tested)  UPPER EXTREMITY MMT:  MMT Right eval Left eval  Shoulder flexion 5- 4-  Shoulder extension    Shoulder abduction 5- 4  Shoulder adduction    Shoulder internal rotation 5 4+  Shoulder external rotation 5 4+  Middle trapezius    Lower trapezius    Elbow flexion    Elbow extension    Wrist flexion    Wrist extension    Wrist ulnar deviation    Wrist radial deviation    Wrist pronation    Wrist supination    Grip strength (lbs)    (Blank rows = not tested)  SHOULDER SPECIAL TESTS: Impingement tests: Hawkins/Kennedy impingement test: positive  Rotator cuff assessment: Empty can test: positive   PALPATION:  Tight bilat upper traps, tender to palpation over left shoulder   TODAY'S TREATMENT:  DATE: 11/11/2022 UBE level 1.2 x3 min each direction with PT present to discuss status 3 way scapular stabilization with blue loop 2x5 bilat 4D ball rolls x 20 each direction bilat L shoulder UE ranger with 1# on left UE for flexion and abduction x20 each Bent forward rows, shoulder horizontal abduction, and shoulder extension with 3# 2x10 each LUE Supine serratus punch x 20 with 3 lbs left Supine shoulder Alphabet A-Z with 3 lbs LUE Supine shoulder flexion  with 3# 2x10 LUE Seated shoulder ER and horizontal abduction with red tband x10 each Seated shoulder ER and horizontal abduction with green tband x10 each (cuing for decreased shoulder shrugging/elevation) Standing lat pull 30# 2x10 Wall push up 2x10   DATE: 11/08/2022 UBE level 1.2 x3 min each direction with PT present to discuss status 3 way scapular stabilization with blue loop 2x5 bilat 4D ball rolls x 20 each direction bilat L shoulder UE ranger with 1# on left UE  Supine serratus punch x 20 with 3 lbs left Supine shoulder Alphabet A-Z with 3 lbs LUE Bent forward rows, shoulder horizontal abduction, and shoulder extension with 3# 2x10 each LUE Trigger Point Dry-Needling  Treatment instructions: Expect mild to moderate muscle soreness. S/S of pneumothorax if dry needled over a lung field, and to seek immediate medical attention should they occur. Patient verbalized understanding of these instructions and education. Patient Consent Given: Yes Education handout provided: Previously provided Muscles treated: bilat upper traps, left cervical and thoracic multifidi, left rhomboids Electrical stimulation performed: No Parameters: N/A Treatment response/outcome: Utilized skilled palpation to identify bony landmarks and trigger points.  Able to illicit twitch response and muscle elongation.  Soft tissue mobilization following to further promote tissue elongation.      DATE: 11/03/2022 UBE level 1.0 6 min x3 min each direction with PT present to discuss status 3 way scapular stabilization with blue loop x 10 each side 4D ball rolls x 20 each direction, left Prone shoulder extension, row and horizontal abduction 2 x 10 each with 2 lbs left Side lying ER with 2 lb 2 x 10 left Supine serratus punch x 20 with 2 lbs left Trigger Point Dry-Needling  Treatment instructions: Expect mild to moderate muscle soreness. S/S of pneumothorax if dry needled over a lung field, and to seek immediate  medical attention should they occur. Patient verbalized understanding of these instructions and education. Patient Consent Given: Yes Education handout provided: Yes Muscles treated: Lt upper trap, rhomboids, Lt thoracic and cervical multifidi Treatment response/outcome: Utilized skilled palpation to identify trigger points.  During dry needling able to palpate muscle twitch and muscle elongation  Elongation and release s/p DN Skilled palpation and monitoring by PT during dry needling    PATIENT EDUCATION: Education details: Issued HEP and provided pt with green theraband Person educated: Patient Education method: Explanation, Demonstration, and Handouts Education comprehension: verbalized understanding and returned demonstration  HOME EXERCISE PROGRAM: Access Code: [email or web address] URL: [email or web address] Date: 10/08/2022 Prepared by: Reather Laurence  Exercises - Seated Scapular Retraction  - 1 x daily - 7 x weekly - 2 sets - 10 reps - Shoulder External Rotation and Scapular Retraction with Resistance  - 1 x daily - 7 x weekly - 2 sets - 10 reps - Standing Shoulder Horizontal Abduction with Resistance  - 1 x daily - 7 x weekly - 2 sets - 10 reps - Seated Upper Trapezius Stretch  - 1 x daily - 7 x weekly - 2 reps - 20 sec hold - Shoulder  Flexion Wall Slide with Towel  - 1 x daily - 7 x weekly - 2 sets - 10 reps - Standing Shoulder Abduction Slides at Wall  - 1 x daily - 7 x weekly - 2 sets - 10 reps  ASSESSMENT:  CLINICAL IMPRESSION: Ms Megginson presents to skilled PT reporting that she felt like after Monday, she started making a turn and improving more.  Patient with increased left shoulder A/ROM noted during visit today and reports decreased pain with ROM.  Patient able to progress from red theraband to green theraband and provided green band for home.  Patient able to initiate weight machines today with lat pull.  Patient requires less frequent cuing during session for posture  corrections.  Patient continues to require skilled PT to progress towards goal related activities.   OBJECTIVE IMPAIRMENTS: decreased ROM, decreased strength, increased muscle spasms, impaired flexibility, postural dysfunction, and pain.   ACTIVITY LIMITATIONS: carrying, lifting, dressing, and reach over head  PARTICIPATION LIMITATIONS: cleaning, laundry, driving, community activity, and yard work  PERSONAL FACTORS: Time since onset of injury/illness/exacerbation and 1 comorbidity: OA  are also affecting patient's functional outcome.   REHAB POTENTIAL: Good  CLINICAL DECISION MAKING: Stable/uncomplicated  EVALUATION COMPLEXITY: Low   GOALS: Goals reviewed with patient? Yes  SHORT TERM GOALS: Target date: 10/22/2022  Pt will be independent with initial HEP. Baseline: Goal status: MET on 10/18/2022  2.  Pt will report at least 25% improvement in symptoms since starting skilled PT. Baseline:  Goal status: MET on 10/18/2022   LONG TERM GOALS: Target date: 11/26/2022  Pt will be independent with advanced HEP to allow for self-progression post discharge. Baseline:  Goal status: Ongoing  2.  Patient will increase FOTO to at least 66 to demonstrate improvements in functional tasks. Baseline: 57 Goal status: INITIAL  3.  Patient will increase left shoulder A/ROM to [facility] to allow her to place objects in overhead cabinets and put on her bra. Baseline:  Goal status: Ongoing (see above)  4.  Patient to increase left shoulder strength to at least 5-/5 to allow her to return to ability to perform yard work again. Baseline:  Goal status: Ongoing  5.  Patient to report ability to perform yard work without increased shoulder pain. Baseline:  Goal status: Ongoing   PLAN:  PT FREQUENCY: 1-2x/week  PT DURATION: 8 weeks  PLANNED INTERVENTIONS: Therapeutic exercises, Therapeutic activity, Neuromuscular re-education, Balance training, Gait training, Patient/Family education, Self  Care, Joint mobilization, Joint manipulation, Aquatic Therapy, Dry Needling, Electrical stimulation, Spinal manipulation, Spinal mobilization, Cryotherapy, Moist heat, Taping, Traction, Ultrasound, Ionotophoresis 4mg /ml Dexamethasone, Manual therapy, and Re-evaluation  PLAN FOR NEXT SESSION: UBE, shoulder strengthening and scapular stabilization, assess response to DN, dry needling/manual therapy as indicated, strengthening, flexibility   Reather Laurence, PT, DPT 11/11/22, 10:13 AM  [facility] [address], [address] [place], [place] [address] Phone # [phone] Fax [phone]

## 2022-11-15 ENCOUNTER — Ambulatory Visit: Payer: Medicare Other | Admitting: Physical Therapy

## 2022-11-15 DIAGNOSIS — M25512 Pain in left shoulder: Secondary | ICD-10-CM | POA: Diagnosis not present

## 2022-11-15 DIAGNOSIS — M6281 Muscle weakness (generalized): Secondary | ICD-10-CM

## 2022-11-15 DIAGNOSIS — G8929 Other chronic pain: Secondary | ICD-10-CM

## 2022-11-15 DIAGNOSIS — R252 Cramp and spasm: Secondary | ICD-10-CM

## 2022-11-15 NOTE — Therapy (Signed)
OUTPATIENT PHYSICAL THERAPY TREATMENT NOTE   Patient Name: Cheryl Wang MRN: [id number] DOB:1950/04/03, 72 y.o., female Today's Date: 11/15/2022  END OF SESSION:  PT End of Session - 11/15/22 1103     Visit Number 9    Date for PT Re-Evaluation 12/03/22    Authorization Type Medicare A    Progress Note Due on Visit 10    PT Start Time 1101    PT Stop Time 1143    PT Time Calculation (min) 42 min    Activity Tolerance Patient tolerated treatment well              Past Medical History:  Diagnosis Date   Arthritis    in hands   Asthma    as a child   Chicken pox    Eczema    Hives    Medical history non-contributory    Spondylosis 11/24/2012   per xray   Wears glasses    Past Surgical History:  Procedure Laterality Date   ABDOMINAL HYSTERECTOMY  2002   VAG HYST-   CARPAL TUNNEL RELEASE Left 08/30/2012   Procedure: CARPAL TUNNEL RELEASE;  Surgeon: Nicki Reaper, MD;  Location: [facility];  Service: Orthopedics;  Laterality: Left;   COLONOSCOPY     cyst removal  10/1988   on chest    KNEE ARTHROSCOPY  1995   RIGHT AND LEFT   KNEE SURGERY     Bil   TONSILLECTOMY     TOTAL KNEE ARTHROPLASTY Left 05/13/2014   Procedure: LEFT TOTAL KNEE ARTHROPLASTY;  Surgeon: Dannielle Huh, MD;  Location: [facility];  Service: Orthopedics;  Laterality: Left;   TRIGGER FINGER RELEASE Left 08/30/2012   Procedure: STS RELEASE LEFT THUMB;  Surgeon: Nicki Reaper, MD;  Location: [facility];  Service: Orthopedics;  Laterality: Left;   Patient Active Problem List   Diagnosis Date Noted   S/P total knee arthroplasty 05/13/2014   Hx of migraines 11/24/2012   Left knee DJD 11/24/2012   Right knee DJD 11/24/2012   Obesity, unspecified 11/24/2012   Spondylosis, cervical 11/24/2012    PCP: Selina Cooley, MD  REFERRING PROVIDER: Rodolph Bong, MD  REFERRING DIAG: [phone] (ICD-10-CM) - Chronic left shoulder pain  THERAPY DIAG:  Chronic left shoulder  pain  Cramp and spasm  Muscle weakness (generalized)  Rationale for Evaluation and Treatment: Rehabilitation  ONSET DATE: September 2023 had a fall when she landed on her shoulder  SUBJECTIVE:  SUBJECTIVE STATEMENT: I've turned a corner.  The DN really helps me.  I had a chiro adjustment this morning.  Having that with this helps.  I'm sleeping better.  Pt states she has had several falls and always onto the left side.  Hand dominance: Right  PERTINENT HISTORY: Bilateral TKA, hysterectomy, left wrist Fx  PAIN:  Are you having pain? Yes: NPRS scale: 2/10 Pain location: left shoulder Pain description: aching Aggravating factors: planks, reaching behind back, overhead exercises Relieving factors: rest, and hanging arm down, distraction  PRECAUTIONS: None  RED FLAGS: None   WEIGHT BEARING RESTRICTIONS: No  FALLS:  Has patient fallen in last 6 months? Yes. 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  NEXT MD VISIT: Dr Denyse Amass November 16, 2022  OBJECTIVE:   DIAGNOSTIC FINDINGS:  Left shoulder radiograph on 10/05/2022: Per Dr Logan Bores:  Mild to moderate glenohumeral DJD.  Mild AC DJD.  No acute fractures.  Awaiting formal review.  PATIENT SURVEYS:  Eval:  FOTO 57 (projected 66 by visit 10)  COGNITION: Overall cognitive status: Within functional limits for tasks  assessed     SENSATION: WFL  POSTURE: Rounded shoulder and slight forward flexed posture with ambulation  UPPER EXTREMITY ROM:   Active ROM Right eval Left eval Left 11/11/22  Shoulder flexion 146 128 140  Shoulder extension 50 48 52  Shoulder abduction 145 132 142  Shoulder adduction     Shoulder internal rotation To mid thoracic To hip with pain To L1  Shoulder external rotation     Elbow flexion     Elbow extension     Wrist flexion     Wrist extension     Wrist ulnar deviation     Wrist radial deviation     Wrist pronation     Wrist supination     (Blank rows = not tested)  UPPER EXTREMITY MMT:  MMT Right eval Left eval  Shoulder flexion 5- 4-  Shoulder extension    Shoulder abduction 5- 4  Shoulder adduction    Shoulder internal rotation 5 4+  Shoulder external rotation 5 4+  Middle trapezius    Lower trapezius    Elbow flexion    Elbow extension    Wrist flexion    Wrist extension    Wrist ulnar deviation    Wrist radial deviation    Wrist pronation    Wrist supination    Grip strength (lbs)    (Blank rows = not tested)  SHOULDER SPECIAL TESTS: Impingement tests: Hawkins/Kennedy impingement test: positive  Rotator cuff assessment: Empty can test: positive   PALPATION:  Tight bilat upper traps, tender to palpation over left shoulder   TODAY'S TREATMENT:  DATE: 11/15/2022 UBE level 1.2 x3 min each direction with PT present to discuss status Bent forward rows, shoulder horizontal abduction, and shoulder extension with 3# 2x10 each LUE Counter push ups 10x (difficult on the 8th rep) Standing lat pull 30#  2x10 (pt was talking and sat down on the low seat to do pulls instead of standing and fell backward onto her bottom, she denies injury and laughs about it the rest of session) 3 way scapular stabilization on wall  with blue loop 2x5 bilat Trigger Point Dry-Needling  Treatment instructions: Expect mild to moderate muscle soreness. S/S of pneumothorax if dry needled over a lung field, and to seek immediate medical attention should they occur. Patient verbalized understanding of these instructions and education. Patient Consent Given: Yes Education handout provided: Previously provided Muscles treated: bilat upper traps, left cervical multifidi; bil levator scap Electrical stimulation performed: No Parameters: N/A Treatment response/outcome: Utilized skilled palpation to identify bony landmarks and trigger points.  Able to illicit twitch response and muscle elongation.  Soft tissue mobilization following to further promote tissue elongation.   Heat 2 min bil cervical musculature seated  DATE: 11/11/2022 UBE level 1.2 x3 min each direction with PT present to discuss status 3 way scapular stabilization with blue loop 2x5 bilat 4D ball rolls x 20 each direction bilat L shoulder UE ranger with 1# on left UE for flexion and abduction x20 each Bent forward rows, shoulder horizontal abduction, and shoulder extension with 3# 2x10 each LUE Supine serratus punch x 20 with 3 lbs left Supine shoulder Alphabet A-Z with 3 lbs LUE Supine shoulder flexion with 3# 2x10 LUE Seated shoulder ER and horizontal abduction with red tband x10 each Seated shoulder ER and horizontal abduction with green tband x10 each (cuing for decreased shoulder shrugging/elevation) Standing lat pull 30# 2x10 Wall push up 2x10   DATE: 11/08/2022 UBE level 1.2 x3 min each direction with PT present to discuss status 3 way scapular stabilization with blue loop 2x5 bilat 4D ball rolls x 20 each direction bilat L shoulder UE ranger with 1# on left UE  Supine serratus punch x 20 with 3 lbs left Supine shoulder Alphabet A-Z with 3 lbs LUE Bent forward rows, shoulder horizontal abduction, and shoulder extension with 3# 2x10 each LUE Trigger  Point Dry-Needling  Treatment instructions: Expect mild to moderate muscle soreness. S/S of pneumothorax if dry needled over a lung field, and to seek immediate medical attention should they occur. Patient verbalized understanding of these instructions and education. Patient Consent Given: Yes Education handout provided: Previously provided Muscles treated: bilat upper traps, left cervical and thoracic multifidi, left rhomboids Electrical stimulation performed: No Parameters: N/A Treatment response/outcome: Utilized skilled palpation to identify bony landmarks and trigger points.  Able to illicit twitch response and muscle elongation.  Soft tissue mobilization following to further promote tissue elongation.      DATE: 11/03/2022 UBE level 1.0 6 min x3 min each direction with PT present to discuss status 3 way scapular stabilization with blue loop x 10 each side 4D ball rolls x 20 each direction, left Prone shoulder extension, row and horizontal abduction 2 x 10 each with 2 lbs left Side lying ER with 2 lb 2 x 10 left Supine serratus punch x 20 with 2 lbs left Trigger Point Dry-Needling  Treatment instructions: Expect mild to moderate muscle soreness. S/S of pneumothorax if dry needled over a lung field, and to seek immediate medical attention should they occur. Patient verbalized understanding of these  instructions and education. Patient Consent Given: Yes Education handout provided: Yes Muscles treated: Lt upper trap, rhomboids, Lt thoracic and cervical multifidi Treatment response/outcome: Utilized skilled palpation to identify trigger points.  During dry needling able to palpate muscle twitch and muscle elongation  Elongation and release s/p DN Skilled palpation and monitoring by PT during dry needling    PATIENT EDUCATION: Education details: Issued HEP and provided pt with green theraband Person educated: Patient Education method: Explanation, Demonstration, and Handouts Education  comprehension: verbalized understanding and returned demonstration  HOME EXERCISE PROGRAM: Access Code: [email or web address] URL: [email or web address] Date: 10/08/2022 Prepared by: Clydie Braun Menke  Exercises - Seated Scapular Retraction  - 1 x daily - 7 x weekly - 2 sets - 10 reps - Shoulder External Rotation and Scapular Retraction with Resistance  - 1 x daily - 7 x weekly - 2 sets - 10 reps - Standing Shoulder Horizontal Abduction with Resistance  - 1 x daily - 7 x weekly - 2 sets - 10 reps - Seated Upper Trapezius Stretch  - 1 x daily - 7 x weekly - 2 reps - 20 sec hold - Shoulder Flexion Wall Slide with Towel  - 1 x daily - 7 x weekly - 2 sets - 10 reps - Standing Shoulder Abduction Slides at Wall  - 1 x daily - 7 x weekly - 2 sets - 10 reps  ASSESSMENT:  CLINICAL IMPRESSION: The patient reports she continues to be challenged by the rows, horizontal abduction and extension strengthening ex's but feels it is helpful.  Progressed from wall push ups to counter push ups which was also challenging with the last few reps.  As we headed to the standing lat bar we were conversing about her fall history when she sat down on the low seat (rather than standing as done last time) and fell backwards onto her bottom still holding the lat bar.  She states she thought it would be heavier.  The patient denies injury and laughs about it for the remainder of session.  The patient benefits significantly from dry needling and manual therapy to stimulate underlying myofascial trigger points and muscular tissue for management of neuromusculoskeletal pain and address movement impairments.  Much improved soft tissue mobility and decreased tender point size and number following treatment session.     OBJECTIVE IMPAIRMENTS: decreased ROM, decreased strength, increased muscle spasms, impaired flexibility, postural dysfunction, and pain.   ACTIVITY LIMITATIONS: carrying, lifting, dressing, and reach over  head  PARTICIPATION LIMITATIONS: cleaning, laundry, driving, community activity, and yard work  PERSONAL FACTORS: Time since onset of injury/illness/exacerbation and 1 comorbidity: OA  are also affecting patient's functional outcome.   REHAB POTENTIAL: Good  CLINICAL DECISION MAKING: Stable/uncomplicated  EVALUATION COMPLEXITY: Low   GOALS: Goals reviewed with patient? Yes  SHORT TERM GOALS: Target date: 10/22/2022  Pt will be independent with initial HEP. Baseline: Goal status: MET on 10/18/2022  2.  Pt will report at least 25% improvement in symptoms since starting skilled PT. Baseline:  Goal status: MET on 10/18/2022   LONG TERM GOALS: Target date: 11/26/2022  Pt will be independent with advanced HEP to allow for self-progression post discharge. Baseline:  Goal status: Ongoing  2.  Patient will increase FOTO to at least 66 to demonstrate improvements in functional tasks. Baseline: 57 Goal status: INITIAL  3.  Patient will increase left shoulder A/ROM to [facility] to allow her to place objects in overhead cabinets and put on her bra. Baseline:  Goal status: Ongoing (see  above)  4.  Patient to increase left shoulder strength to at least 5-/5 to allow her to return to ability to perform yard work again. Baseline:  Goal status: Ongoing  5.  Patient to report ability to perform yard work without increased shoulder pain. Baseline:  Goal status: Ongoing   PLAN:  PT FREQUENCY: 1-2x/week  PT DURATION: 8 weeks  PLANNED INTERVENTIONS: Therapeutic exercises, Therapeutic activity, Neuromuscular re-education, Balance training, Gait training, Patient/Family education, Self Care, Joint mobilization, Joint manipulation, Aquatic Therapy, Dry Needling, Electrical stimulation, Spinal manipulation, Spinal mobilization, Cryotherapy, Moist heat, Taping, Traction, Ultrasound, Ionotophoresis 4mg /ml Dexamethasone, Manual therapy, and Re-evaluation  PLAN FOR NEXT SESSION: UBE, shoulder  strengthening and scapular stabilization, assess response to DN, dry needling/manual therapy as indicated, strengthening, flexibility  Lavinia Sharps, PT 11/15/22 1:31 PM Phone: [phone] Fax: [phone]  [facility] [address] [place], [place] [address] Phone # [phone] Fax [phone]

## 2022-11-16 ENCOUNTER — Ambulatory Visit (INDEPENDENT_AMBULATORY_CARE_PROVIDER_SITE_OTHER): Payer: Medicare Other | Admitting: Family Medicine

## 2022-11-16 ENCOUNTER — Ambulatory Visit: Payer: Medicare Other

## 2022-11-16 ENCOUNTER — Encounter: Payer: Self-pay | Admitting: Family Medicine

## 2022-11-16 VITALS — BP 128/72 | HR 75 | Ht 63.0 in

## 2022-11-16 DIAGNOSIS — R252 Cramp and spasm: Secondary | ICD-10-CM

## 2022-11-16 DIAGNOSIS — M6281 Muscle weakness (generalized): Secondary | ICD-10-CM

## 2022-11-16 DIAGNOSIS — M25512 Pain in left shoulder: Secondary | ICD-10-CM

## 2022-11-16 DIAGNOSIS — G8929 Other chronic pain: Secondary | ICD-10-CM

## 2022-11-16 DIAGNOSIS — R293 Abnormal posture: Secondary | ICD-10-CM

## 2022-11-16 NOTE — Therapy (Signed)
OUTPATIENT PHYSICAL THERAPY TREATMENT NOTE   Patient Name: Cheryl Wang MRN: [id number] DOB:Nov 11, 1950, 72 y.o., female Today's Date: 11/16/2022 Progress Note Reporting Period 10/08/22 to 11/16/22  See note below for Objective Data and Assessment of Progress/Goals.     END OF SESSION:  PT End of Session - 11/16/22 1058     Visit Number 10    Date for PT Re-Evaluation 12/03/22    Progress Note Due on Visit 20    PT Start Time 1012    PT Stop Time 1055    PT Time Calculation (min) 43 min    Activity Tolerance Patient tolerated treatment well    Behavior During Therapy WFL for tasks assessed/performed               Past Medical History:  Diagnosis Date   Arthritis    in hands   Asthma    as a child   Chicken pox    Eczema    Hives    Medical history non-contributory    Spondylosis 11/24/2012   per xray   Wears glasses    Past Surgical History:  Procedure Laterality Date   ABDOMINAL HYSTERECTOMY  2002   VAG HYST-   CARPAL TUNNEL RELEASE Left 08/30/2012   Procedure: CARPAL TUNNEL RELEASE;  Surgeon: Nicki Reaper, MD;  Location: [facility];  Service: Orthopedics;  Laterality: Left;   COLONOSCOPY     cyst removal  10/1988   on chest    KNEE ARTHROSCOPY  1995   RIGHT AND LEFT   KNEE SURGERY     Bil   TONSILLECTOMY     TOTAL KNEE ARTHROPLASTY Left 05/13/2014   Procedure: LEFT TOTAL KNEE ARTHROPLASTY;  Surgeon: Dannielle Huh, MD;  Location: [facility];  Service: Orthopedics;  Laterality: Left;   TRIGGER FINGER RELEASE Left 08/30/2012   Procedure: STS RELEASE LEFT THUMB;  Surgeon: Nicki Reaper, MD;  Location: [facility];  Service: Orthopedics;  Laterality: Left;   Patient Active Problem List   Diagnosis Date Noted   S/P total knee arthroplasty 05/13/2014   Hx of migraines 11/24/2012   Left knee DJD 11/24/2012   Right knee DJD 11/24/2012   Obesity, unspecified 11/24/2012   Spondylosis, cervical 11/24/2012    PCP: Selina Cooley,  MD  REFERRING PROVIDER: Rodolph Bong, MD  REFERRING DIAG: [phone] (ICD-10-CM) - Chronic left shoulder pain  THERAPY DIAG:  Chronic left shoulder pain  Cramp and spasm  Abnormal posture  Muscle weakness (generalized)  Rationale for Evaluation and Treatment: Rehabilitation  ONSET DATE: September 2023 had a fall when she landed on her shoulder  SUBJECTIVE:  SUBJECTIVE STATEMENT: 80% overall improvement since the start of care.  No pain after fall yesterday.   Hand dominance: Right  PERTINENT HISTORY: Bilateral TKA, hysterectomy, left wrist Fx  PAIN:  Are you having pain? Yes: NPRS scale: 2/10 Pain location: left shoulder Pain description: aching Aggravating factors: planks, reaching behind back, overhead exercises Relieving factors: rest, and hanging arm down, distraction  PRECAUTIONS: None  RED FLAGS: None   WEIGHT BEARING RESTRICTIONS: No  FALLS:  Has patient fallen in last 6 months? Yes. 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  NEXT MD VISIT: Dr Denyse Amass November 16, 2022  OBJECTIVE:   DIAGNOSTIC FINDINGS:  Left shoulder radiograph on 10/05/2022: Per Dr Logan Bores:  Mild to moderate glenohumeral DJD.  Mild AC DJD.  No acute fractures.  Awaiting formal review.  PATIENT SURVEYS:  Eval:  FOTO 57 (projected 66 by visit 10)  COGNITION: Overall cognitive status: Within functional  limits for tasks assessed     SENSATION: WFL  POSTURE: Rounded shoulder and slight forward flexed posture with ambulation  UPPER EXTREMITY ROM:   Active ROM Right eval Left eval Left 11/11/22  Shoulder flexion 146 128 140  Shoulder extension 50 48 52  Shoulder abduction 145 132 142  Shoulder adduction     Shoulder internal rotation To mid thoracic To hip with pain To L1  Shoulder external rotation     Elbow flexion     Elbow extension     Wrist flexion     Wrist extension     Wrist ulnar deviation     Wrist radial deviation     Wrist pronation     Wrist supination     (Blank rows = not tested)  UPPER EXTREMITY MMT:  MMT Right eval Left eval Left 11/16/22  Shoulder flexion 5- 4- 5-  Shoulder extension     Shoulder abduction 5- 4 4+  Shoulder adduction     Shoulder internal rotation 5 4+ 5  Shoulder external rotation 5 4+ 4+  Middle trapezius     Lower trapezius     Elbow flexion     Elbow extension     Wrist flexion     Wrist extension     Wrist ulnar deviation     Wrist radial deviation     Wrist pronation     Wrist supination     Grip strength (lbs)     (Blank rows = not tested)  SHOULDER SPECIAL TESTS: Impingement tests: Hawkins/Kennedy impingement test: positive  Rotator cuff assessment: Empty can test: positive   PALPATION:  Tight bilat upper traps, tender to palpation over left shoulder   TODAY'S TREATMENT:      DATE: 11/16/2022 UBE level 1.5 x3 min each direction with PT present to discuss status 3 way scapular stabilization with blue loop 2x5 bilat Standing rows: 20# 2x10 4D ball rolls x 20 each direction bilat- Bent forward rows, shoulder horizontal abduction, and shoulder extension with 3# 2x10 each LUE Supine serratus punch x 20 with 3 lbs left Supine shoulder flexion with 3# 2x10 LUE Seated shoulder ER with blue band 2x10 Standing lat  pull 40# 2x10 3 way raises: 1# added 2x10 Wall push up 2x10                                                                                                                                      DATE: 11/15/2022 UBE level 1.2 x3 min each direction with PT present to discuss status Bent forward rows, shoulder horizontal abduction, and shoulder extension with 3# 2x10 each LUE Counter push ups 10x (difficult on the 8th rep) Standing lat pull 30#  2x10 (pt was talking and sat down on the low seat to do pulls instead of standing and fell backward onto her bottom, she denies injury and laughs about it the rest of session) 3 way scapular stabilization on wall with blue loop 2x5 bilat Trigger Point Dry-Needling  Treatment instructions: Expect mild to moderate muscle soreness. S/S of pneumothorax if dry needled over a lung field, and to seek immediate medical attention should they occur. Patient verbalized understanding of these instructions and education. Patient Consent Given: Yes Education handout provided: Previously provided Muscles treated: bilat upper traps, left cervical multifidi; bil levator scap Electrical stimulation performed: No Parameters: N/A Treatment response/outcome: Utilized skilled palpation to identify bony landmarks and trigger points.  Able to illicit twitch response and muscle elongation.  Soft tissue mobilization following to further promote tissue elongation.   Heat 2 min bil cervical musculature seated  DATE: 11/11/2022 UBE level 1.2 x3 min each direction with PT present to discuss status 3 way scapular stabilization with blue loop 2x5 bilat 4D ball rolls x 20 each direction bilat L shoulder UE ranger with 1# on left UE for flexion and abduction x20 each Bent forward rows, shoulder horizontal abduction, and shoulder extension with 3# 2x10 each LUE Supine serratus punch x 20 with 3 lbs left Supine shoulder Alphabet A-Z with 3 lbs LUE Supine shoulder flexion with 3# 2x10 LUE Seated shoulder ER and horizontal abduction with red tband x10 each Seated shoulder ER and horizontal  abduction with green tband x10 each (cuing for decreased shoulder shrugging/elevation) Standing lat pull 30# 2x10 Wall push up 2x10  PATIENT EDUCATION: Education details: Issued HEP and provided pt with green theraband Person educated: Patient Education method: Explanation, Demonstration, and Handouts Education comprehension: verbalized understanding and returned demonstration  HOME EXERCISE PROGRAM: Access Code: [email or web address] URL: [email or web address] Date: 10/08/2022 Prepared by: Clydie Braun Menke  Exercises - Seated Scapular Retraction  - 1 x daily - 7 x weekly - 2 sets - 10 reps - Shoulder External Rotation and Scapular Retraction with Resistance  - 1 x daily - 7 x weekly - 2 sets - 10 reps - Standing Shoulder Horizontal Abduction with Resistance  - 1 x daily - 7 x weekly - 2 sets - 10 reps - Seated Upper Trapezius Stretch  - 1 x daily - 7 x weekly - 2 reps - 20 sec hold - Shoulder Flexion Wall Slide with  Towel  - 1 x daily - 7 x weekly - 2 sets - 10 reps - Standing Shoulder Abduction Slides at Wall  - 1 x daily - 7 x weekly - 2 sets - 10 reps  ASSESSMENT:  CLINICAL IMPRESSION: Pt saw MD today and will follow-up only as needed.  She reports 80% overall improvement since the start of care.  Pt with improved Lt shoulder strength with testing today.  She did well with all exercises today and PT monitored for technique and pain throughout session.  The patient benefits significantly from dry needling and manual therapy to stimulate underlying myofascial trigger points and muscular tissue for management of neuromusculoskeletal pain and address movement impairments. Pt will likely D/C next week to HEP.  OBJECTIVE IMPAIRMENTS: decreased ROM, decreased strength, increased muscle spasms, impaired flexibility, postural dysfunction, and pain.   ACTIVITY LIMITATIONS: carrying, lifting, dressing, and reach over head  PARTICIPATION LIMITATIONS: cleaning, laundry, driving, community  activity, and yard work  PERSONAL FACTORS: Time since onset of injury/illness/exacerbation and 1 comorbidity: OA  are also affecting patient's functional outcome.   REHAB POTENTIAL: Good  CLINICAL DECISION MAKING: Stable/uncomplicated  EVALUATION COMPLEXITY: Low   GOALS: Goals reviewed with patient? Yes  SHORT TERM GOALS: Target date: 10/22/2022  Pt will be independent with initial HEP. Baseline: Goal status: MET on 10/18/2022  2.  Pt will report at least 25% improvement in symptoms since starting skilled PT. Baseline:  Goal status: MET on 10/18/2022   LONG TERM GOALS: Target date: 11/26/2022  Pt will be independent with advanced HEP to allow for self-progression post discharge. Baseline:  Goal status: Ongoing  2.  Patient will increase FOTO to at least 66 to demonstrate improvements in functional tasks. Baseline: 57 Goal status: INITIAL  3.  Patient will increase left shoulder A/ROM to [facility] to allow her to place objects in overhead cabinets and put on her bra. Baseline:  Goal status: Ongoing (see above)  4.  Patient to increase left shoulder strength to at least 5-/5 to allow her to return to ability to perform yard work again. Baseline: see above (11/16/22) Goal status: Ongoing  5.  Patient to report ability to perform yard work without increased shoulder pain. Baseline: only pain with very heavy tasks (11/16/22) Goal status: Ongoing   PLAN:  PT FREQUENCY: 1-2x/week  PT DURATION: 8 weeks  PLANNED INTERVENTIONS: Therapeutic exercises, Therapeutic activity, Neuromuscular re-education, Balance training, Gait training, Patient/Family education, Self Care, Joint mobilization, Joint manipulation, Aquatic Therapy, Dry Needling, Electrical stimulation, Spinal manipulation, Spinal mobilization, Cryotherapy, Moist heat, Taping, Traction, Ultrasound, Ionotophoresis 4mg /ml Dexamethasone, Manual therapy, and Re-evaluation  PLAN FOR NEXT SESSION: continue strength, flexibility  and manual therapy as needed. Probable D/C next week.  DN one more time and finalize HEP  Lorrene Reid, PT 11/16/22 11:00 AM   [facility] [address], [address] [place], [place] [address] Phone # [phone] Fax [phone]

## 2022-11-16 NOTE — Progress Notes (Signed)
   I, Stevenson Clinch, CMA acting as a scribe for Cheryl Graham, MD.  Cheryl Wang is a 72 y.o. female who presents to [facility] today for f/u L shoulder pain. Pt was last seen by Dr. Denyse Wang on 10/05/22 and she was referred to PT, completing 9 visits.  Today, pt reports improvement of shoulder sx with PT. Has started seeing Dr. Manson Wang, [place], in [place]. Doing dry-needling has been helpful. Denies new or worsening sx since last visit.   Dx imaging: 10/05/22 L shoulder XR  Pertinent review of systems: No fevers or chills  Relevant historical information: History knee replacement   Exam:  BP 128/72   Pulse 75   Ht 5\' 3"  (1.6 m)   SpO2 97%   BMI 36.46 kg/m  General: Well Developed, well nourished, and in no acute distress.   MSK: Left shoulder: Normal-appearing Normal motion. Intact strength.    Lab and Radiology Results   EXAM: LEFT SHOULDER - 2+ VIEW   COMPARISON:  None Available.   FINDINGS: Frontal, transscapular, and axillary views of the left shoulder are obtained on 3 images. No fracture, subluxation, or dislocation. Mild hypertrophic changes of the acromioclavicular joint. Soft tissues are unremarkable. Left chest is clear.   IMPRESSION: 1. Mild acromioclavicular joint osteoarthritis. No acute bony abnormality.     Electronically Signed   By: Sharlet Salina M.D.   On: 10/10/2022 11:30 I, Cheryl Wang, personally (independently) visualized and performed the interpretation of the images attached in this note.    Assessment and Plan: 72 y.o. female with left shoulder pain thought to be due to rotator cuff impingement.  Doing well with PT.  Check back as needed.  Continue home exercise program.  She would like very much to avoid medications if possible.   PDMP not reviewed this encounter. No orders of the defined types were placed in this encounter.  No orders of the defined types were placed in this  encounter.    Discussed warning signs or symptoms. Please see discharge instructions. Patient expresses understanding.   The above documentation has been reviewed and is accurate and complete Cheryl Wang, M.D.

## 2022-11-16 NOTE — Patient Instructions (Addendum)
Thank you for coming in today.   Recheck as needed.   I am here for you if needed for this stuff.

## 2022-11-17 ENCOUNTER — Ambulatory Visit: Payer: Medicare Other | Admitting: Rehabilitative and Restorative Service Providers"

## 2022-11-22 ENCOUNTER — Encounter: Payer: Self-pay | Admitting: Rehabilitative and Restorative Service Providers"

## 2022-11-22 ENCOUNTER — Ambulatory Visit: Payer: Medicare Other | Admitting: Rehabilitative and Restorative Service Providers"

## 2022-11-22 DIAGNOSIS — R252 Cramp and spasm: Secondary | ICD-10-CM

## 2022-11-22 DIAGNOSIS — M25512 Pain in left shoulder: Secondary | ICD-10-CM | POA: Diagnosis not present

## 2022-11-22 DIAGNOSIS — G8929 Other chronic pain: Secondary | ICD-10-CM

## 2022-11-22 DIAGNOSIS — M6281 Muscle weakness (generalized): Secondary | ICD-10-CM

## 2022-11-22 DIAGNOSIS — R293 Abnormal posture: Secondary | ICD-10-CM

## 2022-11-22 NOTE — Therapy (Signed)
OUTPATIENT PHYSICAL THERAPY TREATMENT NOTE   Patient Name: Cheryl Wang MRN: [id number] DOB:[birth year], 72 y.o., female Today's Date: 11/22/2022     END OF SESSION:  PT End of Session - 11/22/22 0932     Visit Number 11    Date for PT Re-Evaluation 12/03/22    Authorization Type Medicare A    Progress Note Due on Visit 20    PT Start Time 0930    PT Stop Time 1010    PT Time Calculation (min) 40 min    Activity Tolerance Patient tolerated treatment well    Behavior During Therapy [facility] for tasks assessed/performed               Past Medical History:  Diagnosis Date   Arthritis    in hands   Asthma    as a child   Chicken pox    Eczema    Hives    Medical history non-contributory    Spondylosis 11/24/2012   per xray   Wears glasses    Past Surgical History:  Procedure Laterality Date   ABDOMINAL HYSTERECTOMY  2002   VAG HYST-   CARPAL TUNNEL RELEASE Left 08/30/2012   Procedure: CARPAL TUNNEL RELEASE;  Surgeon: Nicki Reaper, MD;  Location: [facility];  Service: Orthopedics;  Laterality: Left;   COLONOSCOPY     cyst removal  10/1988   on chest    KNEE ARTHROSCOPY  1995   RIGHT AND LEFT   KNEE SURGERY     Bil   TONSILLECTOMY     TOTAL KNEE ARTHROPLASTY Left 05/13/2014   Procedure: LEFT TOTAL KNEE ARTHROPLASTY;  Surgeon: Dannielle Huh, MD;  Location: [facility];  Service: Orthopedics;  Laterality: Left;   TRIGGER FINGER RELEASE Left 08/30/2012   Procedure: STS RELEASE LEFT THUMB;  Surgeon: Nicki Reaper, MD;  Location: [facility];  Service: Orthopedics;  Laterality: Left;   Patient Active Problem List   Diagnosis Date Noted   S/P total knee arthroplasty 05/13/2014   Hx of migraines 11/24/2012   Left knee DJD 11/24/2012   Right knee DJD 11/24/2012   Obesity, unspecified 11/24/2012   Spondylosis, cervical 11/24/2012    PCP: Selina Cooley, MD  REFERRING PROVIDER: Rodolph Bong, MD  REFERRING DIAG: [phone] (ICD-10-CM) - Chronic  left shoulder pain  THERAPY DIAG:  Chronic left shoulder pain  Cramp and spasm  Abnormal posture  Muscle weakness (generalized)  Rationale for Evaluation and Treatment: Rehabilitation  ONSET DATE: September 2023 had a fall when she landed on her shoulder  SUBJECTIVE:  SUBJECTIVE STATEMENT: Pt denies pain, states that she is still feeling much better with her shoulder. Hand dominance: Right  PERTINENT HISTORY: Bilateral TKA, hysterectomy, left wrist Fx  PAIN:  Are you having pain? Yes: NPRS scale: 0/10 Pain location: left shoulder Pain description: aching Aggravating factors: planks, reaching behind back, overhead exercises Relieving factors: rest, and hanging arm down, distraction  PRECAUTIONS: None  RED FLAGS: None   WEIGHT BEARING RESTRICTIONS: No  FALLS:  Has patient fallen in last 6 months? Yes. 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  NEXT MD VISIT: Dr Denyse Amass November 16, 2022  OBJECTIVE:   DIAGNOSTIC FINDINGS:  Left shoulder radiograph on 10/05/2022: Per Dr Logan Bores:  Mild to moderate glenohumeral DJD.  Mild AC DJD.  No acute fractures.  Awaiting formal review.  PATIENT SURVEYS:  Eval:  FOTO 57 (projected 6 by visit 10) 11/22/2022:  FOTO 87 (goal met)  COGNITION: Overall cognitive status: Within functional limits for tasks assessed     SENSATION: WFL  POSTURE: Rounded  shoulder and slight forward flexed posture with ambulation  UPPER EXTREMITY ROM:   Active ROM Right eval Left eval Left 11/11/22  Shoulder flexion 146 128 140  Shoulder extension 50 48 52  Shoulder abduction 145 132 142  Shoulder adduction     Shoulder internal rotation To mid thoracic To hip with pain To L1  Shoulder external rotation     Elbow flexion     Elbow extension     Wrist flexion     Wrist extension     Wrist ulnar deviation     Wrist radial deviation     Wrist pronation     Wrist supination     (Blank rows = not tested)  UPPER EXTREMITY MMT:  MMT Right eval Left eval Left 11/16/22  Shoulder flexion 5- 4- 5-  Shoulder extension     Shoulder abduction 5- 4 4+  Shoulder adduction     Shoulder internal rotation 5 4+ 5  Shoulder external rotation 5 4+ 4+  Middle trapezius     Lower trapezius     Elbow flexion     Elbow extension     Wrist flexion     Wrist extension     Wrist ulnar deviation     Wrist radial deviation     Wrist pronation     Wrist supination     Grip strength (lbs)     (Blank rows = not tested)  SHOULDER SPECIAL TESTS: Impingement tests: Hawkins/Kennedy impingement test: positive  Rotator cuff assessment: Empty can test: positive   PALPATION:  Tight bilat upper traps, tender to palpation over left shoulder   TODAY'S TREATMENT:       DATE: 11/22/2022 UBE level 1.2 x3 min each direction with PT present to discuss status Counter push-up 2x10 3 way scapular stabilization with blue loop 2x5 bilat Seated shoulder ER with blue band 2x10 FOTO Seated rows 25# 2x10 Standing lat pull 40# 2x10 Quadruped alt UE/LE extension 2x10 Seated shoulder horizontal abduction with green tband 2x10   DATE: 11/16/2022 UBE level 1.5 x3 min each direction with PT  present to discuss status 3 way scapular stabilization with blue loop 2x5 bilat Standing rows: 20# 2x10 4D ball rolls x 20 each direction bilat- Bent forward rows, shoulder horizontal  abduction, and shoulder extension with 3# 2x10 each LUE Supine serratus punch x 20 with 3 lbs left Supine shoulder flexion with 3# 2x10 LUE Seated shoulder ER with blue band 2x10 Standing lat pull 40# 2x10 3 way raises: 1# added 2x10 Wall push up 2x10                                                                                                                                     DATE: 11/15/2022 UBE level 1.2 x3 min each direction with PT present to discuss status Bent forward rows, shoulder horizontal abduction, and shoulder extension with 3# 2x10 each LUE Counter push ups 10x (difficult on the 8th rep) Standing lat pull 30#  2x10 (pt was talking and sat down on the low seat to do pulls instead of standing and fell backward onto her bottom, she denies injury and laughs about it the rest of session) 3 way scapular stabilization on wall with blue loop 2x5 bilat Trigger Point Dry-Needling  Treatment instructions: Expect mild to moderate muscle soreness. S/S of pneumothorax if dry needled over a lung field, and to seek immediate medical attention should they occur. Patient verbalized understanding of these instructions and education. Patient Consent Given: Yes Education handout provided: Previously provided Muscles treated: bilat upper traps, left cervical multifidi; bil levator scap Electrical stimulation performed: No Parameters: N/A Treatment response/outcome: Utilized skilled palpation to identify bony landmarks and trigger points.  Able to illicit twitch response and muscle elongation.  Soft tissue mobilization following to further promote tissue elongation.   Heat 2 min bil cervical musculature seated   PATIENT EDUCATION: Education details: Issued HEP and provided pt with green theraband Person educated: Patient Education method: Explanation, Demonstration, and Handouts Education comprehension: verbalized understanding and returned demonstration  HOME EXERCISE PROGRAM: Access  Code: [email or web address] URL: [email or web address] Date: 10/08/2022 Prepared by: Clydie Braun Jeweldean Drohan  Exercises - Seated Scapular Retraction  - 1 x daily - 7 x weekly - 2 sets - 10 reps - Shoulder External Rotation and Scapular Retraction with Resistance  - 1 x daily - 7 x weekly - 2 sets - 10 reps - Standing Shoulder Horizontal Abduction with Resistance  - 1 x daily - 7 x weekly - 2 sets - 10 reps - Seated Upper Trapezius Stretch  - 1 x daily - 7 x weekly - 2 reps - 20 sec hold - Shoulder Flexion Wall Slide with Towel  - 1 x daily - 7 x weekly - 2 sets - 10 reps - Standing Shoulder Abduction Slides at Wall  - 1 x daily - 7 x weekly - 2 sets - 10 reps  ASSESSMENT:  CLINICAL IMPRESSION: Ms Jurkiewicz has made excellent progress with skilled PT.  She reports that she  is able to self fasten her bra and place items in the overhead cabinets without difficulty.  Patient has met FOTO goal and is on track for discharge next visit, as anticipated.  Patient provided with updated HEP for home use.  OBJECTIVE IMPAIRMENTS: decreased ROM, decreased strength, increased muscle spasms, impaired flexibility, postural dysfunction, and pain.   ACTIVITY LIMITATIONS: carrying, lifting, dressing, and reach over head  PARTICIPATION LIMITATIONS: cleaning, laundry, driving, community activity, and yard work  PERSONAL FACTORS: Time since onset of injury/illness/exacerbation and 1 comorbidity: OA  are also affecting patient's functional outcome.   REHAB POTENTIAL: Good  CLINICAL DECISION MAKING: Stable/uncomplicated  EVALUATION COMPLEXITY: Low   GOALS: Goals reviewed with patient? Yes  SHORT TERM GOALS: Target date: 10/22/2022  Pt will be independent with initial HEP. Baseline: Goal status: MET on 10/18/2022  2.  Pt will report at least 25% improvement in symptoms since starting skilled PT. Baseline:  Goal status: MET on 10/18/2022   LONG TERM GOALS: Target date: 11/26/2022  Pt will be independent with  advanced HEP to allow for self-progression post discharge. Baseline:  Goal status: Ongoing  2.  Patient will increase FOTO to at least 66 to demonstrate improvements in functional tasks. Baseline: 57 Goal status: MET on 11/22/2022  3.  Patient will increase left shoulder A/ROM to [facility] to allow her to place objects in overhead cabinets and put on her bra. Baseline:  Goal status: MET on 11/22/2022  4.  Patient to increase left shoulder strength to at least 5-/5 to allow her to return to ability to perform yard work again. Baseline: see above (11/16/22) Goal status: Ongoing  5.  Patient to report ability to perform yard work without increased shoulder pain. Baseline: only pain with very heavy tasks (11/16/22) Goal status: Ongoing   PLAN:  PT FREQUENCY: 1-2x/week  PT DURATION: 8 weeks  PLANNED INTERVENTIONS: Therapeutic exercises, Therapeutic activity, Neuromuscular re-education, Balance training, Gait training, Patient/Family education, Self Care, Joint mobilization, Joint manipulation, Aquatic Therapy, Dry Needling, Electrical stimulation, Spinal manipulation, Spinal mobilization, Cryotherapy, Moist heat, Taping, Traction, Ultrasound, Ionotophoresis 4mg /ml Dexamethasone, Manual therapy, and Re-evaluation  PLAN FOR NEXT SESSION: discharge if goals met    Reather Laurence, PT, DPT 11/22/22, 10:46 AM  [facility] [address] [place], [place] [address] Phone # [phone] Fax [phone]

## 2022-11-25 ENCOUNTER — Encounter: Payer: Medicare Other | Admitting: Rehabilitative and Restorative Service Providers"

## 2022-11-25 ENCOUNTER — Ambulatory Visit: Payer: Self-pay | Admitting: Rehabilitative and Restorative Service Providers"

## 2022-11-25 ENCOUNTER — Encounter: Payer: Self-pay | Admitting: Rehabilitative and Restorative Service Providers"

## 2022-11-25 DIAGNOSIS — M25512 Pain in left shoulder: Secondary | ICD-10-CM | POA: Diagnosis not present

## 2022-11-25 DIAGNOSIS — G8929 Other chronic pain: Secondary | ICD-10-CM

## 2022-11-25 DIAGNOSIS — M6281 Muscle weakness (generalized): Secondary | ICD-10-CM

## 2022-11-25 DIAGNOSIS — R252 Cramp and spasm: Secondary | ICD-10-CM

## 2022-11-25 DIAGNOSIS — R293 Abnormal posture: Secondary | ICD-10-CM

## 2022-11-25 NOTE — Therapy (Signed)
OUTPATIENT PHYSICAL THERAPY TREATMENT NOTE AND DISCHARGE SUMMARY   Patient Name: Cheryl Wang MRN: [id number] DOB:1950/08/12, 72 y.o., female Today's Date: 11/25/2022     END OF SESSION:  PT End of Session - 11/25/22 1149     Visit Number 12    Date for PT Re-Evaluation 11/26/22    Authorization Type Medicare A    Progress Note Due on Visit 20    PT Start Time 1140    PT Stop Time 1220    PT Time Calculation (min) 40 min    Activity Tolerance Patient tolerated treatment well    Behavior During Therapy WFL for tasks assessed/performed               Past Medical History:  Diagnosis Date   Arthritis    in hands   Asthma    as a child   Chicken pox    Eczema    Hives    Medical history non-contributory    Spondylosis 11/24/2012   per xray   Wears glasses    Past Surgical History:  Procedure Laterality Date   ABDOMINAL HYSTERECTOMY  2002   VAG HYST-   CARPAL TUNNEL RELEASE Left 08/30/2012   Procedure: CARPAL TUNNEL RELEASE;  Surgeon: Nicki Reaper, MD;  Location: [facility];  Service: Orthopedics;  Laterality: Left;   COLONOSCOPY     cyst removal  10/1988   on chest    KNEE ARTHROSCOPY  1995   RIGHT AND LEFT   KNEE SURGERY     Bil   TONSILLECTOMY     TOTAL KNEE ARTHROPLASTY Left 05/13/2014   Procedure: LEFT TOTAL KNEE ARTHROPLASTY;  Surgeon: Dannielle Huh, MD;  Location: [facility];  Service: Orthopedics;  Laterality: Left;   TRIGGER FINGER RELEASE Left 08/30/2012   Procedure: STS RELEASE LEFT THUMB;  Surgeon: Nicki Reaper, MD;  [facility];  Service: Orthopedics;  Laterality: Left;   Patient Active Problem List   Diagnosis Date Noted   S/P total knee arthroplasty 05/13/2014   Hx of migraines 11/24/2012   Left knee DJD 11/24/2012   Right knee DJD 11/24/2012   Obesity, unspecified 11/24/2012   Spondylosis, cervical 11/24/2012    PCP: Selina Cooley, MD  REFERRING PROVIDER: Rodolph Bong, MD  REFERRING DIAG: [phone]  (ICD-10-CM) - Chronic left shoulder pain  THERAPY DIAG:  Chronic left shoulder pain  Cramp and spasm  Abnormal posture  Muscle weakness (generalized)  Rationale for Evaluation and Treatment: Rehabilitation  ONSET DATE: September 2023 had a fall when she landed on her shoulder  SUBJECTIVE:  SUBJECTIVE STATEMENT: Pt denies pain, states that she is ready for discharge. Hand dominance: Right  PERTINENT HISTORY: Bilateral TKA, hysterectomy, left wrist Fx  PAIN:  Are you having pain? No  PRECAUTIONS: None  RED FLAGS: None   WEIGHT BEARING RESTRICTIONS: No  FALLS:  Has patient fallen in last 6 months? Yes. 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  NEXT MD VISIT: Dr Denyse Amass November 16, 2022  OBJECTIVE:   DIAGNOSTIC FINDINGS:  Left shoulder radiograph on 10/05/2022: Per Dr Logan Bores:  Mild to moderate glenohumeral DJD.  Mild AC DJD.  No acute fractures.  Awaiting formal review.  PATIENT SURVEYS:  Eval:  FOTO 57 (projected 59 by visit 10) 11/22/2022:  FOTO 87 (goal met)  COGNITION: Overall cognitive status: Within functional limits for tasks assessed     SENSATION: WFL  POSTURE: Rounded shoulder and slight forward flexed posture with ambulation  UPPER EXTREMITY ROM:   Active ROM Right eval Left eval Left 11/11/22  Shoulder flexion 146 128 140  Shoulder extension 50 48 52  Shoulder  abduction 145 132 142  Shoulder adduction     Shoulder internal rotation To mid thoracic To hip with pain To L1  Shoulder external rotation     Elbow flexion     Elbow extension     Wrist flexion     Wrist extension     Wrist ulnar deviation     Wrist radial deviation     Wrist pronation     Wrist supination     (Blank rows = not tested)  UPPER EXTREMITY MMT:  MMT Right eval Left eval Left 11/16/22 Left  11/25/22  Shoulder flexion 5- 4- 5- 5-  Shoulder extension      Shoulder abduction 5- 4 4+ 5-  Shoulder adduction      Shoulder internal rotation 5 4+ 5 5  Shoulder external rotation 5 4+ 4+ 5-  Middle trapezius      Lower trapezius      Elbow flexion      Elbow extension      Wrist flexion      Wrist extension      Wrist ulnar deviation      Wrist radial deviation      Wrist pronation      Wrist supination      Grip strength (lbs)      (Blank rows = not tested)    TODAY'S TREATMENT:       DATE: 11/25/2022 UBE level 1.3 x3 min each direction with PT present to discuss status 3 way scapular stabilization with blue loop 2x5 bilat Standing shoulder ER with blue loop 2x10 Counter push-up 2x10 Standing lat pull 40# 2x10 Seated rows 25# 2x10 Seated shoulder horizontal abduction with green tband 2x10 Quadruped alt UE/LE extension 2x10 Body blade with 90 degree elbow flexion 2x30 sec bilat Body blade with arm outstretch 2x15 sec 4D ball rolls x 20 each direction bilat   DATE: 11/22/2022 UBE level 1.2 x3 min each direction with PT present to discuss status Counter push-up 2x10 3 way scapular stabilization with blue loop 2x5 bilat Seated  shoulder ER with blue band 2x10 FOTO Seated rows 25# 2x10 Standing lat pull 40# 2x10 Quadruped alt UE/LE extension 2x10 Seated shoulder horizontal abduction with green tband 2x10   DATE: 11/16/2022 UBE level 1.5 x3 min each direction with PT present to discuss status 3 way scapular stabilization with blue loop 2x5  bilat Standing rows: 20# 2x10 4D ball rolls x 20 each direction bilat- Bent forward rows, shoulder horizontal abduction, and shoulder extension with 3# 2x10 each LUE Supine serratus punch x 20 with 3 lbs left Supine shoulder flexion with 3# 2x10 LUE Seated shoulder ER with blue band 2x10 Standing lat pull 40# 2x10 3 way raises: 1# added 2x10 Wall push up 2x10                                                                                                                                      PATIENT EDUCATION: Education details: Issued HEP and provided pt with green theraband Person educated: Patient Education method: Explanation, Demonstration, and Handouts Education comprehension: verbalized understanding and returned demonstration  HOME EXERCISE PROGRAM: Access Code: [email or web address] URL: [email or web address] Date: 10/08/2022 Prepared by: Clydie Braun Danita Proud  Exercises - Seated Scapular Retraction  - 1 x daily - 7 x weekly - 2 sets - 10 reps - Shoulder External Rotation and Scapular Retraction with Resistance  - 1 x daily - 7 x weekly - 2 sets - 10 reps - Standing Shoulder Horizontal Abduction with Resistance  - 1 x daily - 7 x weekly - 2 sets - 10 reps - Seated Upper Trapezius Stretch  - 1 x daily - 7 x weekly - 2 reps - 20 sec hold - Shoulder Flexion Wall Slide with Towel  - 1 x daily - 7 x weekly - 2 sets - 10 reps - Standing Shoulder Abduction Slides at Wall  - 1 x daily - 7 x weekly - 2 sets - 10 reps  ASSESSMENT:  CLINICAL IMPRESSION: Ms Meltzer has made excellent progress with skilled PT and states that she has been able to resume all desired activities.  Patient with increased left shoulder strength and able to incorporate higher level activities, such as body blade today.  Patient with good mobility and without pain throughout session.  Patient has met all goals and is ready for discharge from skilled PT today to continue with HEP.  OBJECTIVE IMPAIRMENTS: decreased ROM,  decreased strength, increased muscle spasms, impaired flexibility, postural dysfunction, and pain.   ACTIVITY LIMITATIONS: carrying, lifting, dressing, and reach over head  PARTICIPATION LIMITATIONS: cleaning, laundry, driving, community activity, and yard work  PERSONAL FACTORS: Time since onset of injury/illness/exacerbation and 1 comorbidity: OA  are also affecting patient's functional outcome.   REHAB POTENTIAL: Good  CLINICAL DECISION MAKING: Stable/uncomplicated  EVALUATION COMPLEXITY: Low   GOALS: Goals reviewed with patient? Yes  SHORT TERM GOALS: Target date: 10/22/2022  Pt will be independent with initial HEP. Baseline: Goal status:  MET on 10/18/2022  2.  Pt will report at least 25% improvement in symptoms since starting skilled PT. Baseline:  Goal status: MET on 10/18/2022   LONG TERM GOALS: Target date: 11/26/2022  Pt will be independent with advanced HEP to allow for self-progression post discharge. Baseline:  Goal status: MET  2.  Patient will increase FOTO to at least 66 to demonstrate improvements in functional tasks. Baseline: 57 Goal status: MET on 11/22/2022  3.  Patient will increase left shoulder A/ROM to [facility] to allow her to place objects in overhead cabinets and put on her bra. Baseline:  Goal status: MET on 11/22/2022  4.  Patient to increase left shoulder strength to at least 5-/5 to allow her to return to ability to perform yard work again. Baseline: see above (11/16/22) Goal status: MET  5.  Patient to report ability to perform yard work without increased shoulder pain. Baseline: only pain with very heavy tasks (11/16/22) Goal status: MET   PLAN:  PT FREQUENCY: 1-2x/week  PT DURATION: 8 weeks  PLANNED INTERVENTIONS: Therapeutic exercises, Therapeutic activity, Neuromuscular re-education, Balance training, Gait training, Patient/Family education, Self Care, Joint mobilization, Joint manipulation, Aquatic Therapy, Dry Needling, Electrical  stimulation, Spinal manipulation, Spinal mobilization, Cryotherapy, Moist heat, Taping, Traction, Ultrasound, Ionotophoresis 4mg /ml Dexamethasone, Manual therapy, and Re-evaluation    PHYSICAL THERAPY DISCHARGE SUMMARY   Patient agrees to discharge. Patient goals were met. Patient is being discharged due to meeting the stated rehab goals.     Reather Laurence, PT, DPT 11/25/22, 12:28 PM  [facility] [address], [address] [place], [place] [address] Phone # [phone] Fax [phone]

## 2023-10-09 NOTE — Progress Notes (Signed)
 Cardiology Office Note:  .   Date:  10/10/2023 ID:  Cheryl Wang, DOB 10-13-1950, MRN [id number] PCP: Xenia Devonshire, MD [facility] Providers Cardiologist:  None   Patient Profile: .      PMH Prediabetes Mixed hyperlipidemia Coronary artery calcification CT Calcium score at [facility] 07/13/23 CAC score 152 (50-75th percentile based on Hoff data base) LM 0, LAD 106, Cx 46, RCA 0, PDA 0       History of Present Illness: .   Discussed the use of AI scribe software for clinical note transcription with the patient, who gave verbal consent to proceed.  History of Present Illness Cheryl Wang is a very pleasant 73 year old who presents with concerns about coronary artery calcification. CT calcium score completed 07/13/23 with results as outlined above. We reviewed the findings of her test in detail. She is generally very active with 5 grandchildren. 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 Admits she is more sedentary since retirement, previously getting 10-20000 steps daily, now getting [phone]. She denies chest pain, shortness of breath, palpitations, orthopnea, PND, presyncope or syncope. An episode of chest pain 15 years ago was evaluated as non-cardiac. She has a history of pleurisy but has not experienced it recently. She has been working with healthy weight and wellness and has increased her exercise with swimming 3 times per week and plans to incorporate more strength training. Her diet now excludes sugar and includes more protein and vegetables. She has lost four pounds in six weeks. She started taking Zepbound five weeks ago for weight loss, but insurance coverage will end soon. She is not on any other medications except for vitamins.  She reports occasional swelling in her right ankle, attributed to venous issues. Her brother who is just a little older than her had CABG a few  years ago.   Family history: Her family history includes Anorectal malformation in her son; Heart disease in her brother; Hyperlipidemia in her father.  Brother - CABG  Discussed the use of AI scribe software for clinical note transcription with the patient, who gave verbal consent to proceed.  ASCVD Risk Score: The 10-year ASCVD risk score (Arnett DK, et al., 2019) is: 11.3%   Values used to calculate the score:     Age: 73 years     Clincally relevant sex: Female     Is Non-Hispanic African American: No     Diabetic: No     Tobacco smoker: No     Systolic Blood Pressure: 130 mmHg     Is BP treated: No     HDL Cholesterol: 51 mg/dL     Total Cholesterol: 221 mg/dL    Diet: Sourdough bread at breakfast Eggs/cottage cheese/veggies, sausage 6 weeks of weight management    Activity: Retired from [employer] in 2020 [place] - 5 Works as a travel Water quality scientist Starting school for [employer] counseling Previously walked 10-20000 steps daily, now [phone] Starting swimming at [employer], plans to start lifting weights Short walks, stationary bike at home  No results found for: LIPOA    ROS: See HPI       Studies Reviewed: SABRA   EKG Interpretation Date/Time:  Monday October 10 2023 10:48:13 EDT Ventricular Rate:  74 PR Interval:  162 QRS Duration:  80 QT Interval:  378 QTC Calculation: 419 R Axis:   -7  Text Interpretation: Normal sinus rhythm Normal ECG When compared with ECG of 15-Sep-2005 18:45, No significant change was found Confirmed by Percy Browning [id number]) on 10/10/2023 10:55:05 AM      Risk Assessment/Calculations:             Physical Exam:   VS: BP 130/68   Pulse 74   Ht 5' 3 (1.6 m)   Wt 209 lb (94.8 kg)   SpO2 97%   BMI 37.02 kg/m   Wt Readings from Last 3 Encounters:  10/10/23 209 lb (94.8 kg)  10/05/22 205 lb 12.8 oz (93.4 kg)  01/03/18 200 lb 12.8 oz (91.1 kg)     GEN: Well nourished, well developed in no acute distress NECK: No  JVD; No carotid bruits CARDIAC: RRR, no murmurs, rubs, gallops RESPIRATORY:  Clear to auscultation without rales, wheezing or rhonchi  ABDOMEN: Soft, non-tender, non-distended EXTREMITIES:  Bilateral ankle edema; No deformity     ASSESSMENT AND PLAN: .    Assessment & Plan Coronary artery calcification Cardiac risk  Family history CAD CT calcium score of 152 with calcification present in the LAD and LCX. EKG reveals NSR with no ST abnormality.  ASCVD risk score is 11.3%. She does not have history of smoking, hypertension or diabetes. She does have history of hyperlipidemia but has not been on treatment and was not aware that LDL was elevated to a concerning level. She denies chest pain, dyspnea, or other symptoms concerning for angina. No indication for further ischemic evaluation at this time/ -We discussed starting aspirin 81 mg daily which she would like to do -Check LP(a) and lipid panel  - Focus on secondary prevention including heart healthy mostly plant based diet avoiding saturated fat, processed foods, simple carbohydrates, and sugar along with  -Aim for at least 150 minutes of moderate intensity exercise each week.   Hyperlipidemia LDL goal < 70 Lipid panel completed 01/12/2023 wth total cholesterol 222, triglycerides 136, HDL 59, LDL-C 137.  She is not on lipid-lowering therapy.  Was told cholesterol was not that high.  Advised goal LDL 70 or lower based on coronary calcification.  We briefly discussed lipid-lowering therapy options. - Recheck lipid panel for surveillance -Advised lipid-lowering therapy will be recommended if LDL remains above goal -Check LP(a) for further risk stratification -Continue healthy lifestyle modifications  Obesity   Is seeing healthy weight and wellness provider for management of obesity. She started Zepbound a few months ago with successful weight loss of a few lbs, which her HCP commended. Insurance will no longer cover the medication at the end  of the month. We discussed lifestyle changes do not typically provide significant weight loss but are beneficial for overall healthy. Advised ASCVD benefit of GLP1 agonists and that gradual tapering is recommended if she discontinues the medication. -Continue healthy diet and regular exercise - Recommendation to incorporate weight loss in addition to cardiovascular fitness  Bilateral ankle edema   Swelling around both ankles, none seen in foot or lower leg. No pain or significant concerns. No symptoms concerning for decreased heart function.  -Elevation encouraged        Dispo: 3-4 months with me  Signed, Browning Percy, NP-C

## 2023-10-10 ENCOUNTER — Encounter (HOSPITAL_BASED_OUTPATIENT_CLINIC_OR_DEPARTMENT_OTHER): Payer: Self-pay | Admitting: Nurse Practitioner

## 2023-10-10 ENCOUNTER — Ambulatory Visit (INDEPENDENT_AMBULATORY_CARE_PROVIDER_SITE_OTHER): Admitting: Nurse Practitioner

## 2023-10-10 VITALS — BP 130/68 | HR 74 | Ht 63.0 in | Wt 209.0 lb

## 2023-10-10 DIAGNOSIS — M25471 Effusion, right ankle: Secondary | ICD-10-CM

## 2023-10-10 DIAGNOSIS — Z6837 Body mass index (BMI) 37.0-37.9, adult: Secondary | ICD-10-CM

## 2023-10-10 DIAGNOSIS — E785 Hyperlipidemia, unspecified: Secondary | ICD-10-CM | POA: Diagnosis not present

## 2023-10-10 DIAGNOSIS — M25472 Effusion, left ankle: Secondary | ICD-10-CM

## 2023-10-10 DIAGNOSIS — R7989 Other specified abnormal findings of blood chemistry: Secondary | ICD-10-CM | POA: Insufficient documentation

## 2023-10-10 DIAGNOSIS — M25474 Effusion, right foot: Secondary | ICD-10-CM

## 2023-10-10 DIAGNOSIS — E782 Mixed hyperlipidemia: Secondary | ICD-10-CM | POA: Insufficient documentation

## 2023-10-10 DIAGNOSIS — M25475 Effusion, left foot: Secondary | ICD-10-CM

## 2023-10-10 DIAGNOSIS — R931 Abnormal findings on diagnostic imaging of heart and coronary circulation: Secondary | ICD-10-CM | POA: Insufficient documentation

## 2023-10-10 DIAGNOSIS — Z131 Encounter for screening for diabetes mellitus: Secondary | ICD-10-CM | POA: Insufficient documentation

## 2023-10-10 DIAGNOSIS — Z8249 Family history of ischemic heart disease and other diseases of the circulatory system: Secondary | ICD-10-CM | POA: Diagnosis not present

## 2023-10-10 DIAGNOSIS — I2510 Atherosclerotic heart disease of native coronary artery without angina pectoris: Secondary | ICD-10-CM | POA: Insufficient documentation

## 2023-10-10 DIAGNOSIS — E66812 Obesity, class 2: Secondary | ICD-10-CM

## 2023-10-10 NOTE — Patient Instructions (Signed)
 Medication Instructions:   Your physician recommends that you continue on your current medications as directed. Please refer to the Current Medication list given to you today.   *If you need a refill on your cardiac medications before your next appointment, please call your pharmacy*  Lab Work:  Your physician recommends that you return for a FASTING NMR/LPa, fasting after midnight.  At your convenience. Paperwork given to pt today.      If you have labs (blood work) drawn today and your tests are completely normal, you will receive your results only by: MyChart Message (if you have MyChart) OR A paper copy in the mail If you have any lab test that is abnormal or we need to change your treatment, we will call you to review the results.  Testing/Procedures:  None ordered.  Follow-Up: At [facility], you and your health needs are our priority.  As part of our continuing mission to provide you with exceptional heart care, our providers are all part of one team.  This team includes your primary Cardiologist (physician) and Advanced Practice Providers or APPs (Physician Assistants and Nurse Practitioners) who all work together to provide you with the care you need, when you need it.  Your next appointment:   4 month(s)  Provider:   Darryle Decent, MD or Rosaline Bane, NP    We recommend signing up for the patient portal called MyChart.  Sign up information is provided on this After Visit Summary.  MyChart is used to connect with patients for Virtual Visits (Telemedicine).  Patients are able to view lab/test results, encounter notes, upcoming appointments, etc.  Non-urgent messages can be sent to your provider as well.   To learn more about what you can do with MyChart, go to [email or web address].

## 2023-10-13 LAB — NMR, LIPOPROFILE
Cholesterol, Total: 192 mg/dL (ref 100–199)
HDL Particle Number: 33.8 umol/L (ref >=30.5)
HDL-C: 52 mg/dL (ref >39)
LDL Particle Number: 1617 nmol/L — ABNORMAL HIGH (ref <1000)
LDL Size: 21 nm (ref >20.5)
LDL-C (NIH Calc): 125 mg/dL — ABNORMAL HIGH (ref 0–99)
LP-IR Score: 39 (ref <=45)
Small LDL Particle Number: 876 nmol/L — ABNORMAL HIGH (ref <=527)
Triglycerides: 81 mg/dL (ref 0–149)

## 2023-10-13 LAB — LIPOPROTEIN A (LPA): Lipoprotein (a): 79.8 nmol/L — ABNORMAL HIGH (ref <75.0)

## 2023-10-14 ENCOUNTER — Ambulatory Visit: Payer: Self-pay | Admitting: Nurse Practitioner

## 2024-02-06 NOTE — Progress Notes (Unsigned)
 Cardiology Office Note:  .   Date:  02/13/2024 ID:  Cheryl Wang, DOB 26-Jul-1950, MRN [id number] PCP: Xenia Devonshire, MD [facility] Providers Cardiologist:  None   Patient Profile: .      PMH Prediabetes Mixed hyperlipidemia Coronary artery calcification CT Calcium  score at [facility] 07/13/23 CAC score 152 (50-75th percentile based on Hoff data base) LM 0, LAD 106, Cx 46, RCA 0, PDA 0 Mildly elevated LP (a)   Referred to cardiology and seen by me on 10/10/23 as a new patient for coronary calcification. CT calcium  score completed 07/13/23 with results as outlined above. She is generally very active with 5 grandchildren. 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 Admits she is more sedentary since retirement, previously getting 10-20000 steps daily, now getting [phone]. Not having any concerning cardiac symptoms. An episode of chest pain 15 years ago was evaluated as non-cardiac. History of pleurisy but has not experienced it recently. She has been working with healthy weight and wellness and has increased her exercise with swimming 3 times per week and plans to incorporate more strength training. Diet now excludes sugar and includes more protein and vegetables. She has lost four pounds in six weeks. She started taking Zepbound five weeks ago for weight loss, but insurance coverage will end soon. No other medications except for vitamins. Occasional swelling in her right ankle, attributed to venous issues. Her brother who is just a little older than her had CABG a few years ago. ASCVD risk score is 10.8%. Knew of history of hyperlipidemia but not on therapy. Through shared decision making, no further ischemia evaluation was pursued.   On lab testing 10/12/23 LP(a) found to be mildly elevated at 79.8 nmol/L. LDL particle number 1617, LDL-C 125, HDL-C 52, triglycerides 81, small LDL-P 876.  She was advised to start lipid lowering therapy and information was provided for her review. Repeat lipid panel 01/12/24 revealed total cholesterol 201, triglycerides 91, LDL-C 136, HDL 46.        History of Present Illness: .   Discussed the use of AI scribe software for clinical note transcription with the patient, who gave verbal consent to proceed.  History of Present Illness Cheryl Wang is a very pleasant 73 year old who is here today for follow-up of hyperlipidemia. She has persistent elevated LDL cholesterol, most recently 136 mg/dL, improved from 847 mg/dL. Coronary calcium  score is 152, in the 50th to 75th percentile for women her age, with calcification in two of four major coronary arteries.  She has increased exercise since our last visit including deep water swimming and exercise classes.  She is on Zepbound for weight management and is overall feeling well and has achieved approximate 20  lb weight loss.  She denies chest pain, shortness of breath, palpitations, orthopnea, PND, edema, presyncope, syncope. She has mildly elevated lipoprotein(a). She is reluctant to start additional medications because of concern about side effects and admits that she and her husband generally try to avoid prescription medications. She feels tired at the end of the day but denies exertional chest pain or dyspnea. Recent primary care labs showed normal liver and kidney function and an A1c of 5.7%, and her GP considers her metabolically healthy. She remains socially active. She is back in school and notes mental fatigue, but no cardiovascular symptoms limit her activities.    Lipoprotein (a)  Date/Time Value Ref Range Status  10/12/2023 08:08 AM 79.8 (H) <75.0 nmol/L Final    Comment:    Note:  Values greater than or equal to 75.0 nmol/L may        indicate an independent risk factor for CHD,        but must be evaluated with caution when applied        to non-Caucasian populations due to the         influence of genetic factors on Lp(a) across        ethnicities.       ROS: See HPI       Studies Reviewed: .          Risk Assessment/Calculations:             Physical Exam:   VS: BP 136/78 (BP Location: Left Arm, Patient Position: Sitting, Cuff Size: Normal)   Pulse 77   Ht 5' 3 (1.6 m)   Wt 188 lb 14.4 oz (85.7 kg)   SpO2 97%   BMI 33.46 kg/m   Wt Readings from Last 3 Encounters:  02/13/24 188 lb 14.4 oz (85.7 kg)  10/10/23 209 lb (94.8 kg)  10/05/22 205 lb 12.8 oz (93.4 kg)     GEN: Well nourished, well developed in no acute distress NECK: No JVD; No carotid bruits CARDIAC: RRR, no murmurs, rubs, gallops RESPIRATORY:  Clear to auscultation without rales, wheezing or rhonchi  ABDOMEN: Soft, non-tender, non-distended EXTREMITIES:  Bilateral ankle edema; No deformity     ASSESSMENT AND PLAN: .    Assessment & Plan Coronary artery calcification Cardiac risk  Family history CAD CT calcium  score of 152 with calcification present in the LAD and LCX. She has increased physical activity andr remains asymptomatic. No symptoms concerning for angina. We discussed potential for further testing and she does not wish to proceed at this time. No indication for further ischemic evaluation at this time.  No history of diabetes, smoking, or hypertension. Lipid-lowering therapy is recommended due to mildly elevated LP(a) and LDL above goal of 70 or lower.  She is agreeable to start moderate dose rosuvastatin  as noted below. - Start rosuvastatin  -Continue aspirin - Heart healthy mostly plant based diet avoiding saturated fat, processed foods, simple carbohydrates, and sugar along with  -Aim for at least 150 minutes of moderate intensity exercise each week.   Hyperlipidemia LDL goal < 70 Mildly elevated LP(a) Lipid panel completed 01/12/2023 wth total cholesterol 222, triglycerides 136, HDL 59, LDL-C 137.  She is not on lipid-lowering therapy.  Has been hesitant to start  lipid-lowering therapy.  Advised goal LDL 70 or lower based on coronary calcification.  She is agreeable to start moderate dose rosuvastatin  and monitor for symptoms.  Advised there are multiple other therapies available if she does not tolerate rosuvastatin . -Start rosuvastatin  10 mg daily - Recheck lipid panel in 8 to 12 weeks for surveillance -Continue healthy lifestyle modifications  Obesity   Seeing healthy weight and wellness provider for management of obesity. Is tolerating Zepbound without concerning side effects and she has achieved 20 lb weight loss.  - Continue healthy diet and regular exercise, including strength training to avoid loss of muscle mass - Management per [facility]      Disposition: 6 months with me  Signed, Rosaline Bane, NP-C

## 2024-02-13 ENCOUNTER — Encounter (HOSPITAL_BASED_OUTPATIENT_CLINIC_OR_DEPARTMENT_OTHER): Payer: Self-pay | Admitting: Nurse Practitioner

## 2024-02-13 ENCOUNTER — Other Ambulatory Visit (HOSPITAL_BASED_OUTPATIENT_CLINIC_OR_DEPARTMENT_OTHER): Payer: Self-pay

## 2024-02-13 ENCOUNTER — Ambulatory Visit (INDEPENDENT_AMBULATORY_CARE_PROVIDER_SITE_OTHER): Admitting: Nurse Practitioner

## 2024-02-13 VITALS — BP 136/78 | HR 77 | Ht 63.0 in | Wt 188.9 lb

## 2024-02-13 DIAGNOSIS — E785 Hyperlipidemia, unspecified: Secondary | ICD-10-CM

## 2024-02-13 DIAGNOSIS — Z7189 Other specified counseling: Secondary | ICD-10-CM

## 2024-02-13 DIAGNOSIS — E66811 Obesity, class 1: Secondary | ICD-10-CM

## 2024-02-13 DIAGNOSIS — E7841 Elevated Lipoprotein(a): Secondary | ICD-10-CM

## 2024-02-13 DIAGNOSIS — I2510 Atherosclerotic heart disease of native coronary artery without angina pectoris: Secondary | ICD-10-CM

## 2024-02-13 DIAGNOSIS — Z5181 Encounter for therapeutic drug level monitoring: Secondary | ICD-10-CM

## 2024-02-13 DIAGNOSIS — Z8249 Family history of ischemic heart disease and other diseases of the circulatory system: Secondary | ICD-10-CM

## 2024-02-13 MED ORDER — ROSUVASTATIN CALCIUM 10 MG PO TABS
10.0000 mg | ORAL_TABLET | Freq: Every day | ORAL | 3 refills | Status: AC
  Filled 2024-02-13: qty 90, 90d supply, fill #0

## 2024-02-13 NOTE — Patient Instructions (Addendum)
 Medication Instructions:  START ROSUVASTATIN  10 MG DAILY   *If you need a refill on your cardiac medications before your next appointment, please call your pharmacy*  Lab Work: FASTING LABS IN 2 TO 3 MONTHS   If you have labs (blood work) drawn today and your tests are completely normal, you will receive your results only by: MyChart Message (if you have MyChart) OR A paper copy in the mail If you have any lab test that is abnormal or we need to change your treatment, we will call you to review the results.  Testing/Procedures: NONE  Follow-Up: At [facility], you and your health needs are our priority.  As part of our continuing mission to provide you with exceptional heart care, our providers are all part of one team.  This team includes your primary Cardiologist (physician) and Advanced Practice Providers or APPs (Physician Assistants and Nurse Practitioners) who all work together to provide you with the care you need, when you need it.  Your next appointment:   6 month(s)  Provider:   Rosaline Bane, NP   We recommend signing up for the patient portal called MyChart.  Sign up information is provided on this After Visit Summary.  MyChart is used to connect with patients for Virtual Visits (Telemedicine).  Patients are able to view lab/test results, encounter notes, upcoming appointments, etc.  Non-urgent messages can be sent to your provider as well.   To learn more about what you can do with MyChart, go to [email or web address].

## 2024-02-14 ENCOUNTER — Encounter (HOSPITAL_BASED_OUTPATIENT_CLINIC_OR_DEPARTMENT_OTHER): Payer: Self-pay | Admitting: Nurse Practitioner

## 2024-02-22 ENCOUNTER — Other Ambulatory Visit (HOSPITAL_BASED_OUTPATIENT_CLINIC_OR_DEPARTMENT_OTHER): Payer: Self-pay
